# Patient Record
Sex: Female | Born: 1970
Health system: Southern US, Community
[De-identification: ages and names within clinical notes are randomized; demographics above are authoritative.]

## PROBLEM LIST (undated history)

## (undated) ENCOUNTER — Emergency Department (HOSPITAL_BASED_OUTPATIENT_CLINIC_OR_DEPARTMENT_OTHER): Admission: EM | Payer: No Typology Code available for payment source | Source: Home / Self Care

## (undated) DIAGNOSIS — K589 Irritable bowel syndrome without diarrhea: Secondary | ICD-10-CM

## (undated) DIAGNOSIS — G2581 Restless legs syndrome: Secondary | ICD-10-CM

## (undated) DIAGNOSIS — K219 Gastro-esophageal reflux disease without esophagitis: Secondary | ICD-10-CM

## (undated) HISTORY — DX: Gastro-esophageal reflux disease without esophagitis: K21.9

## (undated) HISTORY — DX: Irritable bowel syndrome, unspecified: K58.9

## (undated) HISTORY — DX: Restless legs syndrome: G25.81

---

## 1999-09-14 ENCOUNTER — Ambulatory Visit (HOSPITAL_COMMUNITY): Admission: RE | Admit: 1999-09-14 | Discharge: 1999-09-14 | Payer: Self-pay | Admitting: Obstetrics and Gynecology

## 1999-10-05 ENCOUNTER — Inpatient Hospital Stay (HOSPITAL_COMMUNITY): Admission: AD | Admit: 1999-10-05 | Discharge: 1999-10-05 | Payer: Self-pay | Admitting: Obstetrics and Gynecology

## 1999-10-14 ENCOUNTER — Inpatient Hospital Stay (HOSPITAL_COMMUNITY): Admission: AD | Admit: 1999-10-14 | Discharge: 1999-10-14 | Payer: Self-pay | Admitting: Obstetrics and Gynecology

## 1999-10-17 ENCOUNTER — Inpatient Hospital Stay (HOSPITAL_COMMUNITY): Admission: AD | Admit: 1999-10-17 | Discharge: 1999-10-19 | Payer: Self-pay | Admitting: Obstetrics and Gynecology

## 1999-11-20 ENCOUNTER — Other Ambulatory Visit: Admission: RE | Admit: 1999-11-20 | Discharge: 1999-11-20 | Payer: Self-pay | Admitting: Obstetrics and Gynecology

## 2001-04-09 ENCOUNTER — Emergency Department (HOSPITAL_COMMUNITY): Admission: EM | Admit: 2001-04-09 | Discharge: 2001-04-10 | Payer: Self-pay | Admitting: *Deleted

## 2001-04-10 ENCOUNTER — Encounter: Payer: Self-pay | Admitting: *Deleted

## 2001-11-16 ENCOUNTER — Ambulatory Visit (HOSPITAL_COMMUNITY): Admission: RE | Admit: 2001-11-16 | Discharge: 2001-11-16 | Payer: Self-pay | Admitting: Internal Medicine

## 2001-11-16 ENCOUNTER — Encounter: Payer: Self-pay | Admitting: Internal Medicine

## 2001-12-30 ENCOUNTER — Other Ambulatory Visit: Admission: RE | Admit: 2001-12-30 | Discharge: 2001-12-30 | Payer: Self-pay | Admitting: Obstetrics and Gynecology

## 2002-07-22 ENCOUNTER — Ambulatory Visit (HOSPITAL_COMMUNITY): Admission: AD | Admit: 2002-07-22 | Discharge: 2002-07-22 | Payer: Self-pay | Admitting: Obstetrics and Gynecology

## 2002-09-08 ENCOUNTER — Inpatient Hospital Stay (HOSPITAL_COMMUNITY): Admission: AD | Admit: 2002-09-08 | Discharge: 2002-09-14 | Payer: Self-pay | Admitting: Obstetrics & Gynecology

## 2002-09-09 ENCOUNTER — Encounter: Payer: Self-pay | Admitting: Obstetrics & Gynecology

## 2002-09-10 ENCOUNTER — Encounter: Payer: Self-pay | Admitting: Obstetrics and Gynecology

## 2002-09-13 ENCOUNTER — Encounter: Payer: Self-pay | Admitting: Obstetrics and Gynecology

## 2002-09-20 ENCOUNTER — Encounter (INDEPENDENT_AMBULATORY_CARE_PROVIDER_SITE_OTHER): Payer: Self-pay | Admitting: Specialist

## 2002-09-20 ENCOUNTER — Inpatient Hospital Stay (HOSPITAL_COMMUNITY): Admission: RE | Admit: 2002-09-20 | Discharge: 2002-09-22 | Payer: Self-pay | Admitting: Obstetrics and Gynecology

## 2002-09-23 ENCOUNTER — Encounter: Admission: RE | Admit: 2002-09-23 | Discharge: 2002-10-23 | Payer: Self-pay | Admitting: Obstetrics and Gynecology

## 2002-10-28 ENCOUNTER — Other Ambulatory Visit: Admission: RE | Admit: 2002-10-28 | Discharge: 2002-10-28 | Payer: Self-pay | Admitting: Obstetrics and Gynecology

## 2003-01-11 ENCOUNTER — Ambulatory Visit (HOSPITAL_COMMUNITY): Admission: RE | Admit: 2003-01-11 | Discharge: 2003-01-11 | Payer: Self-pay | Admitting: Internal Medicine

## 2003-01-11 ENCOUNTER — Encounter: Payer: Self-pay | Admitting: Internal Medicine

## 2003-05-04 ENCOUNTER — Ambulatory Visit (HOSPITAL_COMMUNITY): Admission: RE | Admit: 2003-05-04 | Discharge: 2003-05-04 | Payer: Self-pay | Admitting: Otolaryngology

## 2003-05-04 ENCOUNTER — Encounter: Payer: Self-pay | Admitting: Otolaryngology

## 2003-10-24 ENCOUNTER — Ambulatory Visit (HOSPITAL_COMMUNITY): Admission: RE | Admit: 2003-10-24 | Discharge: 2003-10-24 | Payer: Self-pay | Admitting: Internal Medicine

## 2004-01-11 ENCOUNTER — Ambulatory Visit (HOSPITAL_COMMUNITY): Admission: RE | Admit: 2004-01-11 | Discharge: 2004-01-11 | Payer: Self-pay | Admitting: Otolaryngology

## 2004-02-24 ENCOUNTER — Other Ambulatory Visit: Admission: RE | Admit: 2004-02-24 | Discharge: 2004-02-24 | Payer: Self-pay | Admitting: Obstetrics and Gynecology

## 2004-04-30 ENCOUNTER — Ambulatory Visit (HOSPITAL_COMMUNITY): Admission: RE | Admit: 2004-04-30 | Discharge: 2004-04-30 | Payer: Self-pay | Admitting: Internal Medicine

## 2004-05-03 ENCOUNTER — Ambulatory Visit (HOSPITAL_COMMUNITY): Admission: RE | Admit: 2004-05-03 | Discharge: 2004-05-03 | Payer: Self-pay | Admitting: Internal Medicine

## 2004-09-09 HISTORY — PX: UPPER GASTROINTESTINAL ENDOSCOPY: SHX188

## 2004-12-11 ENCOUNTER — Ambulatory Visit (HOSPITAL_COMMUNITY): Admission: RE | Admit: 2004-12-11 | Discharge: 2004-12-11 | Payer: Self-pay | Admitting: Internal Medicine

## 2005-05-15 ENCOUNTER — Other Ambulatory Visit: Admission: RE | Admit: 2005-05-15 | Discharge: 2005-05-15 | Payer: Self-pay | Admitting: Obstetrics and Gynecology

## 2005-05-16 ENCOUNTER — Ambulatory Visit: Payer: Self-pay | Admitting: Orthopedic Surgery

## 2005-10-23 ENCOUNTER — Ambulatory Visit: Payer: Self-pay | Admitting: Internal Medicine

## 2005-12-25 ENCOUNTER — Ambulatory Visit: Payer: Self-pay | Admitting: Orthopedic Surgery

## 2006-08-28 ENCOUNTER — Ambulatory Visit: Payer: Self-pay | Admitting: Internal Medicine

## 2006-10-03 ENCOUNTER — Ambulatory Visit: Payer: Self-pay | Admitting: Internal Medicine

## 2007-02-11 ENCOUNTER — Ambulatory Visit: Payer: Self-pay | Admitting: Internal Medicine

## 2007-02-18 ENCOUNTER — Ambulatory Visit (HOSPITAL_COMMUNITY): Admission: RE | Admit: 2007-02-18 | Discharge: 2007-02-18 | Payer: Self-pay | Admitting: Internal Medicine

## 2007-06-18 ENCOUNTER — Ambulatory Visit: Payer: Self-pay | Admitting: Orthopedic Surgery

## 2007-06-19 ENCOUNTER — Ambulatory Visit (HOSPITAL_COMMUNITY): Admission: RE | Admit: 2007-06-19 | Discharge: 2007-06-19 | Payer: Self-pay | Admitting: Orthopedic Surgery

## 2007-09-10 HISTORY — PX: RECTOCELE REPAIR: SHX761

## 2007-10-16 ENCOUNTER — Ambulatory Visit (HOSPITAL_COMMUNITY): Admission: RE | Admit: 2007-10-16 | Discharge: 2007-10-16 | Payer: Self-pay | Admitting: Obstetrics and Gynecology

## 2008-03-24 ENCOUNTER — Inpatient Hospital Stay (HOSPITAL_COMMUNITY): Admission: RE | Admit: 2008-03-24 | Discharge: 2008-03-25 | Payer: Self-pay | Admitting: Obstetrics and Gynecology

## 2009-06-26 ENCOUNTER — Ambulatory Visit (HOSPITAL_COMMUNITY): Admission: RE | Admit: 2009-06-26 | Discharge: 2009-06-26 | Payer: Self-pay | Admitting: Neurosurgery

## 2009-10-10 ENCOUNTER — Ambulatory Visit: Payer: Self-pay | Admitting: Orthopedic Surgery

## 2009-10-10 DIAGNOSIS — S86819A Strain of other muscle(s) and tendon(s) at lower leg level, unspecified leg, initial encounter: Secondary | ICD-10-CM

## 2009-10-10 DIAGNOSIS — S838X9A Sprain of other specified parts of unspecified knee, initial encounter: Secondary | ICD-10-CM | POA: Insufficient documentation

## 2010-08-29 ENCOUNTER — Ambulatory Visit: Payer: Self-pay | Admitting: Orthopedic Surgery

## 2010-08-29 DIAGNOSIS — I890 Lymphedema, not elsewhere classified: Secondary | ICD-10-CM | POA: Insufficient documentation

## 2010-08-30 ENCOUNTER — Encounter: Payer: Self-pay | Admitting: Orthopedic Surgery

## 2010-09-29 ENCOUNTER — Encounter: Payer: Self-pay | Admitting: Internal Medicine

## 2010-10-09 NOTE — Progress Notes (Signed)
Summary: Progress notes  Initial evaluation   Imported By: Jacklynn Ganong 10/10/2009 09:16:18  _____________________________________________________________________  External Attachment:    Type:   Image     Comment:   External Document

## 2010-10-09 NOTE — Progress Notes (Signed)
Summary: Initial evaluation  Initial evaluation   Imported By: Jacklynn Ganong 10/10/2009 09:17:14  _____________________________________________________________________  External Attachment:    Type:   Image     Comment:   External Document

## 2010-10-09 NOTE — Letter (Signed)
Summary: History form  History form   Imported By: Jacklynn Ganong 10/12/2009 16:25:42  _____________________________________________________________________  External Attachment:    Type:   Image     Comment:   External Document

## 2010-10-09 NOTE — Assessment & Plan Note (Signed)
Summary: ?torn calf muscle/needs xr/umr/bsf   Vital Signs:  Patient profile:   40 year old female Pulse rate:   66 / minute Resp:     16 per minute  Vitals Entered By: Fuller Canada MD (October 10, 2009 4:07 PM)  Visit Type:  Follow-up  CC:  LEFT calf pain.  History of Present Illness: 40 year old female injured her LEFT calf over the weekend and she gel over a log while she was hiking.  She complained of immediate medial calf pain developed a limp swelling tenderness.  No knee pain no ankle pain  She placed a Cam Walker on and then ace wrap on and this has helped significantly.  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 MS:  Complains of joint pain; denies rheumatoid arthritis, joint swelling, gout, bone cancer, osteoporosis, and ; muscle pain.  The review of systems is negative for General, Cardiac , Resp, GU, Neuro, Endo, Psych, Derm, EENT, Immunology, and Lymphatic.  Physical Exam  Additional Exam:  This a well-developed well-nourished female grooming and hygiene is intact body habitus medium  Cardiovascular signs are normal.  Skin is intact  Sensation is normal.  Coordination and balance are normal  She is walking with alymph, favoring the LEFT leg has a Cam Walker on.  On inspection of tenderness in  the medial gastroc nontender lateral gastroc Achilles tendon intact ankle motion normal ankle stable knee stable.  Full range of motion in the knee.     Impression & Recommendations:  Problem # 1:  SPRAIN&STRAIN OTHER SPECIFIED SITES KNEE&LEG (ICD-844.8) Assessment New  Orders: Est. Patient Level III (60454)  Patient Instructions: 1)  Should take about 2 weeks  2)  Use compression , ice , active ROM of the ankle [ankle pumps] 3)  Please schedule a follow-up appointment as needed.

## 2010-10-11 NOTE — Letter (Signed)
Summary: CMN for pneumatic  compression device  CMN for pneumatic  compression device   Imported By: Jacklynn Ganong 08/30/2010 09:08:47  _____________________________________________________________________  External Attachment:    Type:   Image     Comment:   External Document

## 2010-10-11 NOTE — Assessment & Plan Note (Signed)
Summary: LEFT LEG PAIN/SWELLING/NEEDS XR/UMR   Allergies: No Known Drug Allergies   Other Orders: Est. Patient Level III (16109)   Orders Added: 1)  Est. Patient Level III [60454]

## 2010-10-11 NOTE — Miscellaneous (Signed)
    40 year old female, status post injury to her LEFT calf presents back with LEFT calf pain  and   lymphedema; and has had some therapy, which she would like reordered. Currently, on Zantac, no anti-inflammatories at present.  Denies any redness, or warmth.  However, complains also that the area is hard and thick.  ROS:  h/o vein stripping; calf medial tear   Physical Exam   GENERAL: Appearance is normal   CDV: normal pulse and temperature   Neuro: sensation was normal   MSK: gait normal   * Inspection: calf size: 18 vs 17 in and minimal tenderness  * ROM knee and ankle normal  *MOTOR: 5/5  *Stability no joint laxity in the ankle   IMPRESSIONS:  LYMPEDEMAPLAN:  THERAPEUTIC INTERVENTIONS

## 2011-01-22 NOTE — Discharge Summary (Signed)
[name], [name]                 ACCOUNT NO.:  [id number]   MEDICAL RECORD NO.:  [id number]          PATIENT TYPE:  INP   LOCATION:  9308                          FACILITY:  [facility]   PHYSICIAN:  Dineen Kid. Rana Snare, M.D.    DATE OF BIRTH:  10/15/1970   DATE OF ADMISSION:  03/24/2008  DATE OF DISCHARGE:  03/25/2008                               DISCHARGE SUMMARY   HISTORY OF PRESENT ILLNESS:  [name] is a 40 year old G2, P2 with  symptomatic pelvic relaxation with digitalization of her constipation,  mild urinary incontinence and pressure symptoms.  She desires any  further incontinence throughout the procedure workup, but she does  desire definitive surgical intervention and treatment of her cystocele  and rectocele and presents for anterior and posterior colporrhaphy.  She  also has a Mirena IUD, which scheduled to be removed and this has been  low with 5 years.  Her husband is scheduled for a vasectomy.   HOSPITAL COURSE:  The patient underwent anterior and posterior  colporrhaphy.  The procedure was uncomplicated.  We also removed the IUD  at that time.  Estimated blood loss during the procedure was 100 mL.  Her postoperative care was unremarkable.   On postop day #1, she had her Foley catheter discontinued.  She was  ambulating without difficulty, tolerating regular diet.  Hemoglobin was  11.4.  She was able to void spontaneously with 200-400 mL and void with  minimal PVR, as checked by ultrasound and by the evening of  postoperative day #1, she desired discharge to home, was tolerating oral  medication for pain, ambulating without difficulty, voiding on her own,  and tolerating a regular diet.  Her abdomen is soft, nontender, and  nondistended.  Previous packing, which was inserted during the surgery  was removed on the morning of postoperative day #1.   DISPOSITION:  The patient was discharged home with followup in the  office 1-2 weeks and offered a prescription for oxycodone,  #30, take as  needed and Ambien, #30, at 10 mg as needed.  I told to return if  increased pain, fever, or bleeding.      Dineen Kid Rana Snare, M.D.  Electronically Signed     DCL/MEDQ  D:  03/25/2008  T:  03/26/2008  Job:  [id number]

## 2011-01-22 NOTE — Op Note (Signed)
[name], [name]                 ACCOUNT NO.:  [id number]   MEDICAL RECORD NO.:  [id number]          PATIENT TYPE:  INP   LOCATION:  9308                          FACILITY:  [facility]   PHYSICIAN:  Dineen Kid. Rana Snare, M.D.    DATE OF BIRTH:  1970/10/28   DATE OF PROCEDURE:  03/24/2008  DATE OF DISCHARGE:                               OPERATIVE REPORT   PREOPERATIVE DIAGNOSES:  Symptomatic pelvic relaxation with cystocele  and rectocele, requiring digitalization, and desires intrauterine device  removal.   POSTOPERATIVE DIAGNOSES:  Symptomatic pelvic relaxation with cystocele  and rectocele, requiring digitalization, and desires intrauterine device  removal.   PROCEDURE:  Anterior-posterior colporrhaphy with intrauterine device  removal.   SURGEON:  Dineen Kid. Rana Snare, MD   ASSISTANT:  Juluis Mire, MD   ANESTHESIA:  General endotracheal.   INDICATIONS:  [name] is a 40 year old G2, P2 worsening problems  with symptomatic pelvic relaxation mostly with digitalization for  constipation.  She does have some mild urinary incontinence and some  pressure symptoms, declines any type of incontinence, procedure, or  workup at this time.  She does currently have a Mirena IUD, which is no  longer guaranteed effective.  It has been longer than 5 years.  She  desires its removal.  Her husband is getting a vasectomy this month.  The risks and benefits of the procedure were discussed at length, which  include but not limited to risk; infection; bleeding; damage to the  uterus, tubes ovary, bowel or bladder; risks associated with worsening  incontinence or catheter wear; and possibility this procedure may not  lasts and may require lifestyle modification including no heavy lifting  in the future.  She does give an informed consent and wished to proceed.   FINDINGS:  At the time of surgery, second-degree cystocele and third-  degree rectocele.   DESCRIPTION OF PROCEDURE:  After adequate analgesia,  the patient was  placed in the dorsal lithotomy position.  She was sterilely prepped and  draped.  Bladder was sterilely drained.  A weighted speculum was placed.  A sponge forceps was used to the to grasp the IUD string, the IUD was  easily removed, and appeared to be normal.  The anterior vaginal mucosa  was grasped at the uterovesical junction.  A midline incision was made,  and the anterior vaginal mucosa was reflected off the cystocele and  urethra.  The pubovesical cervical fascia was then plicated in the  midline using figure-of-eights of 0 Monocryl suture.  A Kelly plication  stitch was placed at the UV angle with good support of the UV noted and  good support of the cystocele noted.  The excess vaginal mucosa was then  trimmed, and the anterior vaginal mucosa was closed with 2-0 Monocryl in  running fashion with good approximation and good hemostasis.  Two Allis  clamps were placed at the angles of the posterior fourchette.  A  triangular flap of skin was reflected off the perineal body.  The  posterior vaginal mucosa was then undermined and dissected with  Metzenbaum scissors and reflected  laterally off the rectal fascia to the  posterior apex of the vagina to the top of the rectocele.  The rectal  fascia was then plicated in the midline using figure-of-eights of 0  Monocryl suture with good approximation and good support noted  posteriorly.  The excess vaginal mucosa was trimmed posteriorly and the  posterior vaginal mucosa was closed with 2-0 Monocryl in a running  fashion.  A suture of 0 Monocryl was placed in a figure-of-eight fashion  plicating the superficial transverse perineal muscles on the perineal  body.  The perineum was then closed with 2-0 Monocryl in a subcuticular  fashion with good approximation and good hemostasis noted.  Vaginal exam  at this time reveals good support of the cystocele in UV angle.  A Foley  catheter was placed with return of clear yellow urine.   The posterior  repair had good posterior support to the apex of the vagina, and the  rectovaginal confirmed the above.  A 1-inch gauze was placed, coated  with estrogen, packing the vagina.  The patient tolerated the procedure  well and was stable on transfer to recovery room.  Sponge and instrument  count was normal x3.  Estimated blood loss 100 mL.  The patient received  1 g of cefotetan preoperatively.      Dineen Kid Rana Snare, M.D.  Electronically Signed     DCL/MEDQ  D:  03/24/2008  T:  03/24/2008  Job:  [id number]

## 2011-01-22 NOTE — Assessment & Plan Note (Signed)
[name], [name]                  CHART#:  [id number]   DATE:  02/11/2007                       DOB:  1970-11-02   PRESENTING COMPLAINTS:  Persistent LUQ abdominal pain.   [name] is a 40 year old Caucasian female patient of Dr. Alonza Smoker who was  here for scheduled visit.  She was last seen in 09/2006 for nausea, left  sided abdominal pain and irregular bowel movements.  I felt she had IBS.  I was not sure as to source of her nausea but PPI was recommended.  She  was also advised fiber supplement, Colace for a bowel movement and  Levbid.  She has seen her gynecologist. She has rectocele which appears  to be contributing to her constipation but she states she is doing much  better as long as she stays on high fiber diet and takes her Colace.  Her nausea is resolved.  She did not see any improvement in her pain  with Levbid.  Presently she is not taking any medicines other than  Colace and fiber supplement.  She continues to have pain in her left  upper quadrant.  She states she has a sore spot which is more or less  constant. She may be of benefit when she is leaning against a chair or  table.  Other times she has a shooting pain which radiates to her back.  She has not been able to pinpoint any triggers.  She does not feel that  this is pain is related to her work.  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  Her  appetite is very good.  She is concerned about gradual weight gain.  She  has gained about 5 pounds in a year and a half.  She states she is [facility]  [facility] although she has not been in the last two weeks.   OBJECTIVE:  Weight 196 pounds, she is 5 feet 11 inches tall.  Pulse 64.  Blood pressure 100/70, temperature is 98.2.  Conjunctivae is pink.  Sclera is nonicteric.  No neck masses are noted.  Her abdomen is  symmetrical, bowel sounds are normal. She has localized tenderness under  the left rib cage.  She also has  some increase going to her rectus  abdominis muscle compared to the right side, no organomegaly or masses  noted.   ASSESSMENT:  Left upper quadrant abdominal pain.  She has had this for a  few months now.  Pain has not improved with resolution of her  constipation with dietary measures and Colace.  She has not responded to  antispasmodic therapy.  She had essentially normal EGD back in 04/2004.  At that time she also had abdominal pelvic CT which was unremarkable.  However, her symptomatology was different and she was also having fever.  I wonder if this is musculoskeletal or neuropathic pain.  Evaluation of  her back is recommended.  We need to rule out intraabdominal process.   PLAN:  Abdominal CT with oral and IV contrast to be performed at [facility] in  the near future.       Lionel December, M.D.  Electronically Signed     NR/MEDQ  D:  02/11/2007  T:  02/12/2007  Job:  [id number]   cc:  Kingsley Callander. Ouida Sills, MD

## 2011-01-22 NOTE — H&P (Signed)
NAMEDESARAI, BARRACK                 ACCOUNT NO.:  [id number]   MEDICAL RECORD NO.:  [id number]          PATIENT TYPE:  AMB   LOCATION:  SDC                           FACILITY:  [facility]   PHYSICIAN:  Dineen Kid. Rana Snare, M.D.    DATE OF BIRTH:  01/07/1971   DATE OF ADMISSION:  03/24/2008  DATE OF DISCHARGE:                              HISTORY & PHYSICAL   HISTORY OF PRESENT ILLNESS:  Ms. Cuda is a 40 year old G2, P2 with  worsening problems with symptomatic pelvic relaxation, mostly  digitalization for constipation.  She does have some mild urinary  incontinence and pressure symptoms but declines any type of incontinence  procedure or workup at this time.  Currently she has a Mirena IUD, which  is no longer guaranteed effective.  It has been longer than 5 years.  Although she has had amenorrhea, she desires removal of this.  Her  husband is actually getting ready to have a vasectomy in the near  future.  She presents for anterior posterior colporrhaphy with removal  of the IUD.  Also of note the patient does not wish hysterectomy or any  type of procedure to the uterus.   PAST MEDICAL HISTORY:  Negative.   PAST SURGICAL HISTORY:  Negative.   PAST OBSTETRICAL HISTORY:  She has had 2 vaginal deliveries.   MEDICATIONS:  None.   ALLERGIES:  She has no known drug allergies.   PHYSICAL EXAMINATION:  VITAL SIGNS:  Her blood pressure is 110/70.  She  is 5 foot, 11 inches.  She is 201 pounds.  HEART:  Regular rate and rhythm.  LUNGS:  Clear to auscultation bilaterally.  ABDOMEN:  Nondistended, nontender.  BREASTS:  Without masses, lymphadenopathy or discharge.  PELVIC:  She has mild loss of the UV angle.  She has a second-degree  cystocele, a third-degree rectocele with Valsalva, which does come to  the introitus.   IMPRESSION AND PLAN:  Symptomatic pelvic relaxation with cystocele and  rectocele requiring digitalization.   PLAN:  Anterior and posterior colporrhaphy.  She declines any  type of  incontinence procedure.  We will, however, place a Kelly plication at  the UV angle.  She desires removal of the IUD at that time.  She will  need to use other forms of contraception until her husband is clear from  his vasectomy and understands this.  I did discuss the risks and  benefits of the procedure at length, which include but are not limited  to risk of infection, bleeding, damage to  the uterus, tubes, ovary, bowel, bladder, ureters.  Possibly this may  worsen, not improve the urinary incontinence.  Discussed catheter wear,  the possibility that this procedure may not last.  She also needs to  limit her lifting in the future.  She does give informed consent.      Dineen Kid Rana Snare, M.D.  Electronically Signed     DCL/MEDQ  D:  03/23/2008  T:  03/23/2008  Job:  [id number]

## 2011-01-25 NOTE — Discharge Summary (Signed)
[name], [name]                           ACCOUNT NO.:  [id number]   MEDICAL RECORD NO.:  [id number]                   PATIENT TYPE:  INP   LOCATION:  9118                                 FACILITY:  [facility]   PHYSICIAN:  Michelle L. Vincente Poli, M.D.            DATE OF BIRTH:  August 09, 1971   DATE OF ADMISSION:  09/20/2002  DATE OF DISCHARGE:  09/22/2002                                 DISCHARGE SUMMARY   ADMISSION DIAGNOSES:  1. Intrauterine pregnancy at 37 weeks estimated gestational age.  2. Placenta previa.   DISCHARGE DIAGNOSES:  1. Status post low transverse cesarean section.  2. Viable female infant.   PROCEDURE:  Primary low transverse cesarean section.   REASON FOR ADMISSION:  Please see dictated H&P.   HOSPITAL COURSE:  The patient is a 40 year old gravida 2 para 1 that is  admitted to the [facility] for a scheduled cesarean  delivery.  The patient's pregnancy had been complicated by placenta previa.  The patient required hospitalization last week for vaginal bleeding.  Uterine contractions were tocolyzed and the bleeding resolved.  The patient  is Rh negative.  RhoGAM was administered.  Amniocentesis revealed an L/S  ratio that was immature.  Betamethasone was administered.  The patient  continued on oral terbutaline due to preterm contractions.  Ultrasound  revealed the placental edge to be 0.5 mm from the internal cervical os  consistent with marginal placenta previa.  C section was scheduled.  On the  a.m. of her surgery the patient was prepped accordingly and taken to the  operating room where spinal anesthesia was administered without difficulty.  A low transverse incision was made with the delivery of a viable female infant  weighing 7 pounds 0 ounces with Apgars of 9 at one minute, 9 at five  minutes.  Umbilical artery pH was 7.37.  The patient tolerated the procedure  well and was taken to the recovery room in stable condition.  On  postoperative day  #1 the patient had good return of bowel function.  Abdomen  was soft.  Fundus was firm and nontender.  Abdominal dressing was noted to  have old drainage noted on the bandage.  Labs revealed hemoglobin 9.7; wbc  count 11.3; platelet count 187,000.  On postoperative day #2 the patient  desired early discharge.  Fundus was firm and nontender.  Abdomen was soft.  Incision was clean, dry, and intact; staples were intact.  RhoGAM had been  given previously and was not indicated at the time of discharge.   CONDITION ON DISCHARGE:  Stable.   DIET:  Regular as tolerated.   ACTIVITY:  No heavy lifting, no vaginal entry, no driving x2 weeks.   FOLLOW-UP:  The patient is to follow up in the office in three to four days  for staple removal.  Otherwise, she is to return back to the office in  approximately one to  two weeks for an incision check.  She is to call for  temperature greater than 100 degrees, persistent nausea and vomiting, heavy  vaginal bleeding, and/or redness or drainage of the incisional site.   DISCHARGE MEDICATIONS:  1. Percocet 5/325 #30 one p.o. q.4-6h. p.r.n. pain.  2. Ibuprofen 600 mg over-the-counter q.6h.  3. Prenatal vitamins one p.o. daily.  4. Colace one p.o. daily p.r.n.     [name], N.P.                        Stann Mainland. Vincente Poli, M.D.    CC/MEDQ  D:  10/26/2002  T:  10/26/2002  Job:  [id number]

## 2011-01-25 NOTE — Op Note (Signed)
[name], [name]                           ACCOUNT NO.:  [id number]   MEDICAL RECORD NO.:  [id number]                   PATIENT TYPE:  AMB   LOCATION:  [facility]                                 [facility]ACILITY:  [facility]   PHYSICIAN:  Dineen Kid. Rana Snare, M.D.                 DATE OF BIRTH:  1971-05-09   DATE OF PROCEDURE:  09/20/2002  DATE OF DISCHARGE:                                 OPERATIVE REPORT   PREOPERATIVE DIAGNOSES:  1. Intrauterine pregnancy at 37 weeks.  2. Placenta previa.   POSTOPERATIVE DIAGNOSES:  1. Intrauterine pregnancy at 37 weeks.  2. Placenta previa.   PROCEDURE:  Primary low segment transverse cesarean section.   SURGEON:  Dineen Kid. Rana Snare, M.D.   ASSISTANT:  Stann Mainland. Vincente Poli, M.D.   ANESTHESIA:  Spinal.   INDICATIONS:  The patient is a 40 year old G2, P1 37 weeks.  Pregnancy  complicated by placenta previa.  She was in the hospital last week with  vaginal bleeding.  She was tocolysed due to contractions and bleeding.  Ultrasound showed marginal previa with a leading edge 6 mm from the cervical  os.  She is Rh negative.  Has received RhoGAM.  She also underwent  amniocentesis last week which showed an LS ratio of 1.3.  She was given  betamethasone x2 since then and one week after her last amniocentesis.  Presents for primary cesarean section.  Risks and benefits were discussed at  length.  Informed consent was obtained.   FINDINGS:  Viable female infant.  Apgars were 9 and 9.  pH 7.37 arterial.  Weight 7 pounds 0 ounces.   DESCRIPTION OF PROCEDURE:  After adequate analgesia the patient was placed  in the supine position with left lateral tilt.  She was sterilely prepped  and draped.  Bladder was sterilely drained.  Pfannenstiel skin incision was  made two fingerbreadths above the pubic symphysis which was taken down  sharply to the fascia and incised transversely, extended superiorly and  inferiorly off the bellies of the rectus muscle which was separated sharply  in the midline.  The peritoneum was entered sharply.  Bladder blade was  placed.  Uterine serosa was elevated, nicked, incised transversely.  Bladder  flap was created and placed behind the bladder blade.  A low segment myotomy  incision was made down to the amniotic sac.  Amniotomy was performed.  Infant's vertex was grasped, delivered atraumatically.  Nuchal cord x1  reduced.  Nares and pharynx suctioned.  The infant was then delivered.  Cord  clamped, cut, and handed to the pediatrician for resuscitation.  Cord blood  was obtained.  Placenta extracted manually.  The uterus was exteriorized,  wiped clean with a dry lap.  The myotomy was closed in two layers, the first  being a running layer, the second being an imbricating layer of 0 Monocryl.  Hemostasis was achieved with 0 Monocryl figure-of-eight.  After adequate  hemostasis was assured, uterus was placed back in the peritoneal cavity and  after a copious amount of irrigation adequate hemostasis was assured  peritoneum was closed with 0 Monocryl.  Rectus muscle plicated in the  midline.  Irrigation was applied and after adequate hemostasis the fascia  was closed with a number 1 Vicryl in a running fashion.  Irrigation was once  again applied and after adequate hemostasis the  skin was stapled and Steri-Strips applied.  The patient tolerated the  procedure well.  Was stable on transfer to the recovery room.  Sponge and  instrument count was normal x3.  The patient received 1 g of metoxepin after  delivery of placenta.                                               Dineen Kid Rana Snare, M.D.    DCL/MEDQ  D:  09/20/2002  T:  09/20/2002  Job:  [id number]

## 2011-01-25 NOTE — H&P (Signed)
   [name], [name]                           ACCOUNT NO.:  [id number]   MEDICAL RECORD NO.:  [id number]                   PATIENT TYPE:  INP   LOCATION:  9147                                 FACILITY:  [facility]   PHYSICIAN:  Dineen Kid. Rana Snare, M.D.                 DATE OF BIRTH:  1971-02-13   DATE OF ADMISSION:  09/20/2002  DATE OF DISCHARGE:                                HISTORY & PHYSICAL   HISTORY OF PRESENT ILLNESS:  The patient is a 40 year old G2, P1 at 37 weeks  who presents for primary cesarean section.  Her pregnancy has been  complicated by placenta previa.  She presented this last week to the  hospital for vaginal bleeding.  She was tocolized due to contractions and  bleeding.  NSTs were performed which showed immature fetal lung with an LS  ratio of 1.3.  She was given betamethasone x2 and cesarean section was  scheduled for one week later.  She was continued on the terbutaline due to  preterm contractions.  Ultrasound showed the placental edge to be 0.5 mm  from the internal cervical os consistent with a marginal previa.  Estimated  date of confinement was October 09, 2002.  She is also RH negative and has  received RhoGAM.   PHYSICAL EXAMINATION:  VITAL SIGNS:  Blood pressure is 120/78.  HEART:  Regular rate and rhythm.  LUNGS:  Clear to auscultation bilaterally.  ABDOMEN:  Gravid, nontender.  PELVIC:  Exam is deferred.   LABORATORY DATA:  Ultrasound again shows placental margin 6 mm from the  internal cervix.   IMPRESSION:  1. Intrauterine pregnancy at 37 weeks.  2. Placenta previa.  3. Vaginal bleeding, status post betamethasone last week.   PLAN:  Primary low segment transverse cesarean section.  Risks and benefits  were discussed at length.  This included but not limited to risks of  infection, bleeding, damage to uterus, tubes, ovaries, bowel and bladder and  also risks of prematurity versus the risks of bleeding and placental  abruption.  She does give her  informed consent and wished to proceed with  the cesarean section.                                               Dineen Kid Rana Snare, M.D.    DCL/MEDQ  D:  09/17/2002  T:  09/17/2002  Job:  [id number]

## 2011-01-25 NOTE — Discharge Summary (Signed)
   NAMESABEEN, PIECHOCKI                           ACCOUNT NO.:  [id number]   MEDICAL RECORD NO.:  [id number]                   PATIENT TYPE:  INP   LOCATION:  9147                                 FACILITY:  [facility]   PHYSICIAN:  Michelle L. Vincente Poli, M.D.            DATE OF BIRTH:  1971-04-22   DATE OF ADMISSION:  09/09/2002  DATE OF DISCHARGE:  09/14/2002                                 DISCHARGE SUMMARY   ADMISSION DIAGNOSES:  1. Intrauterine pregnancy 35 and 4/7 weeks.  2. Marginal placenta previa status post bleed.   DISCHARGE DIAGNOSES:  1. Intrauterine pregnancy 35 and 4/7 weeks.  2. Marginal placenta previa status post bleed.   HOSPITAL COURSE:  The patient is a 40 year old G2, P1 at 56 and 4/7 weeks  with history of partial marginal placenta previa.  She presented with a gush  of bright red bleeding which had resolved by the time she had arrived to  triage at the hospital.  She was admitted for observation and evaluation.  On admission the fetal heart rate was reassuring.  Her bleeding had stopped.  The patient was placed on bed rest and was given some terbutaline for  tocolysis and her hemoglobin had remained stable.  The patient did have an  amniocentesis performed on January 2 and the LS was 1.3 _____ PG.  Given  that the patient had no bleeding between January 3 and January 6 she was  discharged home on strict bed rest.  She was given terbutaline 10.5 q.4h. as  well as Protonix and Ambien.  She was given strict bleeding precautions and  she will follow up on Monday for cesarean section to be performed by Onalee Hua  C. Rana Snare, M.D.                                               Michelle L. Vincente Poli, M.D.    Florestine Avers  D:  11/04/2002  T:  11/04/2002  Job:  [id number]

## 2011-01-25 NOTE — Op Note (Signed)
Mcgrath, [name]                           ACCOUNT NO.:  [id number]   MEDICAL RECORD NO.:  [id number]                   PATIENT TYPE:  AMB   LOCATION:  DAY                                  FACILITY:  [facility]   PHYSICIAN:  Lionel December, M.D.                 DATE OF BIRTH:  Mar 18, 1971   DATE OF PROCEDURE:  04/30/2004  DATE OF DISCHARGE:                                 OPERATIVE REPORT   PROCEDURE:  Esophagogastroduodenoscopy.   ENDOSCOPIST:  Lionel December, M.D.   INDICATIONS:  Julie is a 40 year old Caucasian female who has had intermittent  nausea, vomiting, upper abdominal pain, as well as lower abdominal pain for  6 months.  She had normal abdominal ultrasound and normal lab studies.  She  tried OTC PPI for 2 weeks or so which did not help.  She has been on  antispasmodics for 2 weeks, but still symptomatic.  She is, therefore,  undergoing diagnostic EGD.  The procedure and risks were reviewed with the  patient and informed consent was obtained.   PREOPERATIVE MEDICATIONS:  Demerol 50 mg IV and Versed 6 mg IV in divided  dose.  Cetacaine spray for topical oropharyngeal anesthesia.   FINDINGS:  Procedure performed in endoscopy suite.  The patient's vital  signs and O2 saturation were monitored during the procedure and remained  stable.  The patient was placed in the left lateral recumbent position and  Olympus videoscope was passed via the oropharynx without any difficulty into  the esophagus.   ESOPHAGUS:  Mucosa of the esophagus was normal.  Squamocolumnar junction was  unremarkable.  No hernia was noted.   STOMACH:  It had moderate amount of bile in it; 200 mL was suctioned out.  There was no food debris.  Stomach distended very well with insufflation.  Folds of the proximal stomach were normal.  Examination of the mucosa at  gastric body, antrum, pyloric channel as well as angularis, fundus, and  cardia were normal.   DUODENUM:  Mucosa of the bulb normal.  The scope was  passed into the second  and third part of the duodenum where the mucosa and folds were normal.   Endoscope was withdrawn.  The patient tolerated the procedure well.   FINAL DIAGNOSES:  1. Moderate amount of bile in the stomach but normal     esophagogastroduodenoscopy.  2. Incidental finding of moderate amount of bile in the stomach which may or     may not have anything to do with her nausea and vomiting.   PLAN:  1. Will proceed with small bowel follow through.  If not today, maybe later     in the week.  2. If this study is also normal we will try her on Carafate 2 gm at bedtime     and will do abdominal pelvic CT.      ___________________________________________  Lionel December, M.D.   NR/MEDQ  D:  04/30/2004  T:  04/30/2004  Job:  [id number]   cc:   Kingsley Callander. Ouida Sills, M.D.  [address]  [place]  [place] [address]  Fax: [phone]

## 2011-06-07 LAB — CBC
HCT: 33.2 — ABNORMAL LOW
Hemoglobin: 13.9
Platelets: 123 — ABNORMAL LOW
Platelets: 248
RBC: 3.57 — ABNORMAL LOW
RBC: 4.35
WBC: 5.8
WBC: 7.9

## 2011-07-29 ENCOUNTER — Ambulatory Visit (INDEPENDENT_AMBULATORY_CARE_PROVIDER_SITE_OTHER): Payer: Commercial Managed Care - PPO | Admitting: Internal Medicine

## 2011-07-29 ENCOUNTER — Encounter (INDEPENDENT_AMBULATORY_CARE_PROVIDER_SITE_OTHER): Payer: Self-pay | Admitting: Internal Medicine

## 2011-07-29 VITALS — BP 110/72 | HR 76 | Temp 97.6°F | Resp 14 | Ht 71.0 in | Wt 221.0 lb

## 2011-07-29 DIAGNOSIS — R1012 Left upper quadrant pain: Secondary | ICD-10-CM | POA: Insufficient documentation

## 2011-07-29 DIAGNOSIS — K219 Gastro-esophageal reflux disease without esophagitis: Secondary | ICD-10-CM

## 2011-07-29 MED ORDER — RANITIDINE HCL 150 MG PO TABS: 150.0000 mg | ORAL_TABLET | Freq: Two times a day (BID) | ORAL | Status: DC

## 2011-07-29 NOTE — Patient Instructions (Addendum)
Keep a symptom diary as to frequency of pain and associated symptoms. Can take Advil 2 tablets up to 3 times a day with meals for this pain. Notify if you have next spell of pain so that you could be evaluated while you're having symptoms.

## 2011-07-29 NOTE — Progress Notes (Signed)
Presenting complaint; Left upper quadrant abdominal pain. Subjective: Patient is 40 year old Caucasian female who presents for reevaluation of recurrent LUQ abdominal pain. Last episode occurred 3-4 weeks ago when she called to make an appointment. But now the pain is resolved. She has had this pain off and on for over 5 years. She says this pain comes in spells and it occurs it may last for several days. She does not experience any associated symptoms such as nausea, vomiting, fever, chills, cough, change in her bowel habits or hematuria. She has tried dicyclomine and Levbid in the past but without any relief. This pain may sometimes radiate into her left lower quadrant. She remains with irregular bowel movements. She either has diarrhea and/or constipation. She is not sure if she has more pain with one or the other. He remains with good appetite and her weight has been stable. She would like to get a prescription for ranitidine which was given to her by her gynecologist and it is working well. Current Medications: Current Outpatient Prescriptions  Medication Sig Dispense Refill  . Multiple Vitamin (DAILY MULTIVITAMIN PO) Take by mouth.        . ranitidine (ZANTAC) 150 MG tablet Take 1 tablet (150 mg total) by mouth 2 (two) times daily.  180 tablet  3  . rOPINIRole (REQUIP) 0.5 MG tablet Take 0.5 mg by mouth at bedtime.        . vitamin B-12 (CYANOCOBALAMIN) 1000 MCG tablet Take 1,000 mcg by mouth daily.        Marland Kitchen DISCONTD: ranitidine (ZANTAC) 150 MG tablet Take 150 mg by mouth.         Objective: BP 110/72  Pulse 76  Temp(Src) 97.6 F (36.4 C) (Oral)  Resp 14  Ht 5\' 11"  (1.803 m)  Wt 221 lb (100.245 kg)  BMI 30.82 kg/m2  LMP 07/25/2011 Conjunctiva is pink. Sclerae nonicteric. Oropharyngeal mucosa is normal. No neck masses or thyromegaly noted. No tenderness noted at renal angles. Abdomen is symmetrical. Bowel sounds are normal. Palpation reveals soft abdomen with mild tenderness at LUQ  on deep palpation but no evidence of hepatosplenomegaly. No LE edema or clubbing. Prior workup Abdominopelvic CT with contrast and August 2005 in June 2008 unremarkable. Normal small bowel follow-through in August 2005.  Assessment: #1. Recurrent/chronic left upper quadrant pain. She's had this pain off and on for at least 6 years. She has not responded to therapy for IBS. Prior workup has included abdominal pelvic CT in 2005 and 2008 and a small bowel follow-through 2005. This pain does not appear to be of GI origin. I suspect we are dealing with musculoskeletal or neuropathic pain. It may be worthwhile to examine her while she is having a spell. #2. Chronic GERD. Symptoms well controlled with H2B.   Plan: Patient will keep symptom diary. Can use ibuprofen OTC 400 mg up to 3 times a day as needed with meals. She will call us and pain relapses. New prescription given for ranitidine  150 mg twice a day for 3 months with 3 RFs.

## 2011-07-30 ENCOUNTER — Other Ambulatory Visit (HOSPITAL_COMMUNITY): Payer: Self-pay | Admitting: Obstetrics and Gynecology

## 2011-07-30 DIAGNOSIS — Z139 Encounter for screening, unspecified: Secondary | ICD-10-CM

## 2011-08-09 ENCOUNTER — Ambulatory Visit (HOSPITAL_COMMUNITY)
Admission: RE | Admit: 2011-08-09 | Discharge: 2011-08-09 | Disposition: A | Discharge status: Home / Self Care | Payer: 59 | Source: Ambulatory Visit | Attending: Obstetrics and Gynecology | Admitting: Obstetrics and Gynecology

## 2011-08-09 ENCOUNTER — Ambulatory Visit (HOSPITAL_COMMUNITY): Payer: 59

## 2011-08-09 DIAGNOSIS — Z1231 Encounter for screening mammogram for malignant neoplasm of breast: Secondary | ICD-10-CM | POA: Insufficient documentation

## 2011-08-09 DIAGNOSIS — Z139 Encounter for screening, unspecified: Secondary | ICD-10-CM

## 2012-05-29 ENCOUNTER — Other Ambulatory Visit: Payer: Self-pay

## 2012-07-15 ENCOUNTER — Encounter (INDEPENDENT_AMBULATORY_CARE_PROVIDER_SITE_OTHER): Payer: Self-pay | Admitting: *Deleted

## 2012-08-04 ENCOUNTER — Other Ambulatory Visit (HOSPITAL_COMMUNITY): Payer: Self-pay | Admitting: Obstetrics and Gynecology

## 2012-08-04 DIAGNOSIS — Z139 Encounter for screening, unspecified: Secondary | ICD-10-CM

## 2012-08-14 ENCOUNTER — Ambulatory Visit (HOSPITAL_COMMUNITY)
Admission: RE | Admit: 2012-08-14 | Discharge: 2012-08-14 | Disposition: A | Discharge status: Home / Self Care | Payer: 59 | Source: Ambulatory Visit | Attending: Obstetrics and Gynecology | Admitting: Obstetrics and Gynecology

## 2012-08-14 DIAGNOSIS — Z1231 Encounter for screening mammogram for malignant neoplasm of breast: Secondary | ICD-10-CM | POA: Insufficient documentation

## 2012-08-14 DIAGNOSIS — Z139 Encounter for screening, unspecified: Secondary | ICD-10-CM

## 2012-08-24 ENCOUNTER — Encounter (INDEPENDENT_AMBULATORY_CARE_PROVIDER_SITE_OTHER): Payer: Self-pay | Admitting: Internal Medicine

## 2012-08-24 ENCOUNTER — Ambulatory Visit (INDEPENDENT_AMBULATORY_CARE_PROVIDER_SITE_OTHER): Payer: 59 | Admitting: Internal Medicine

## 2012-08-24 VITALS — BP 118/78 | HR 76 | Temp 97.8°F | Resp 18 | Ht 70.0 in | Wt 217.8 lb

## 2012-08-24 DIAGNOSIS — R1012 Left upper quadrant pain: Secondary | ICD-10-CM

## 2012-08-24 DIAGNOSIS — K219 Gastro-esophageal reflux disease without esophagitis: Secondary | ICD-10-CM

## 2012-08-24 MED ORDER — RANITIDINE HCL 150 MG PO TABS: 150.0000 mg | ORAL_TABLET | Freq: Two times a day (BID) | ORAL | Status: DC

## 2012-08-24 NOTE — Patient Instructions (Signed)
Notify if you have another spell of abdominal pain. Physician will contact you with results of blood work, urinalysis and CT scan completed

## 2012-08-24 NOTE — Progress Notes (Signed)
Presenting complaint;  Intermittent left upper quadrant abdominal pain.  Subjective:  Patient is 41 year old Caucasian female who presents with recurrent left upper quadrant abdominal pain. She has had this pain intermittently for 5 years. She had abdominopelvic CT in June 2008 and was unremarkable. She was last seen in November 2012 was decided to observe her clinical course. She states she has had 5 episodes of pain this year. Last episode lasted for about 2 weeks. She is now pain-free. When she has this pain she feels sore in her left upper quadrant and feels that she has been kicked in this area. She has more pain when she has still and also when she would lie prone. At times she feels as if there is something in that area which does not belong there. She has not found any association with meals or physical activity. She has irregular bowel movements. She there has diarrhea and her constipation but again she does not have one of the other during episodes with pain. Pain does not radiate to other regions. She denies melena rectal bleeding hematuria or his dysuria. She has good appetite and her weight has been stable. She she has taken a few doses of ibuprofen and she believes it uses the pain. She states her heartburn fell controlled with therapy and would like to get prescription renewed. She also has not noted any change or pain with her occupation. 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  Current Medications: Current Outpatient Prescriptions  Medication Sig Dispense Refill  . Multiple Vitamin (DAILY MULTIVITAMIN PO) Take by mouth.        . ranitidine (ZANTAC) 150 MG tablet Take 1 tablet (150 mg total) by mouth 2 (two) times daily.  180 tablet  3  . rOPINIRole (REQUIP) 0.5 MG tablet Take 0.5 mg by mouth at bedtime.         Past medical history; She has chronic GERD. Symptoms are well controlled with H2B. In 2005 she was evaluated for nausea vomiting and left upper  quadrant abdominal pain. She had EGD which was within normal limits other than bothered her stomach followed by normal small bowel follow-through and she also had abdominopelvic CT in August 2005 which was within normal limits. Irritable bowel syndrome. C-section in January 2004. Anterior and posterior repair in 2009.    Objective: Blood pressure 118/78, pulse 76, temperature 97.8 F (36.6 C), temperature source Oral, resp. rate 18, height 5\' 10"  (1.778 m), weight 217 lb 12.8 oz (98.793 kg), last menstrual period 07/06/2012. Patient is alert and in no acute distress. Conjunctiva is pink. Sclera is nonicteric Oropharyngeal mucosa is normal. No neck masses or thyromegaly noted. Cardiac exam with regular rhythm normal S1 and S2. No murmur or gallop noted. Lungs are clear to auscultation. Abdomen is symmetrical. Bowel sounds are normal. Abdomen is soft on palpation with mild tenderness below left costal large and on deep palpation. No organomegaly or masses. No LE edema or clubbing noted.  Lab data; Abdominopelvic CT from June 2008 reviewed. Tail of pancreas appears to be prominent but no focal abnormalities noted.    Assessment:  #1. Intermittent left upper quadrant pain of 5 years duration. Prior workup has been negative. She has not responded to anti-spasmodic therapy. She has irritable bowel syndrome but irregular bowel movements have no relationship to her pain. She has mild tenderness on exam today below the left costal margin. Her history and exam unfortunately does not provide any clues as to the etiology.  Given location of her pain need to make sure she does not have any lesion in tail of pancreas. Will reevaluate with abdominopelvic CT and if it is negative will consider endoscopic ultrasound to have closer look at pancreas. #2. GERD. Sometimes well controlled with H2 B. #3. Irritable bowel syndrome.   Plan:  She will go to lab for CBC with differential, sedimentation rate,  comprehensive chemistry panel and urinalysis. Will schedule  abdominopelvic CT with contrast and compare with prior study of 2008. New prescription for Ranitidine 150 mg by mouth twice a day given for 90 days with 3 refills.

## 2012-08-26 ENCOUNTER — Ambulatory Visit (HOSPITAL_COMMUNITY)
Admission: RE | Admit: 2012-08-26 | Discharge: 2012-08-26 | Disposition: A | Discharge status: Home / Self Care | Payer: 59 | Source: Ambulatory Visit | Attending: Internal Medicine | Admitting: Internal Medicine

## 2012-08-26 DIAGNOSIS — K589 Irritable bowel syndrome without diarrhea: Secondary | ICD-10-CM | POA: Insufficient documentation

## 2012-08-26 DIAGNOSIS — R1032 Left lower quadrant pain: Secondary | ICD-10-CM | POA: Insufficient documentation

## 2012-08-26 DIAGNOSIS — R1012 Left upper quadrant pain: Secondary | ICD-10-CM | POA: Insufficient documentation

## 2012-08-26 DIAGNOSIS — K219 Gastro-esophageal reflux disease without esophagitis: Secondary | ICD-10-CM | POA: Insufficient documentation

## 2012-08-26 LAB — CBC WITH DIFFERENTIAL/PLATELET
Basophils Relative: 1 % (ref 0–1)
Eosinophils Absolute: 0.4 10*3/uL (ref 0.0–0.7)
Lymphocytes Relative: 45 % (ref 12–46)
MCHC: 35 g/dL (ref 30.0–36.0)
MCV: 85.3 fL (ref 78.0–100.0)
Monocytes Relative: 6 % (ref 3–12)
Neutrophils Relative %: 43 % (ref 43–77)

## 2012-08-26 LAB — SEDIMENTATION RATE: Sed Rate: 5 mm/hr (ref 0–22)

## 2012-08-26 LAB — URINALYSIS
Glucose, UA: NEGATIVE mg/dL
Hgb urine dipstick: NEGATIVE
Leukocytes, UA: NEGATIVE
Specific Gravity, Urine: 1.026 (ref 1.005–1.030)
pH: 6 (ref 5.0–8.0)

## 2012-08-26 LAB — COMPREHENSIVE METABOLIC PANEL
Alkaline Phosphatase: 57 U/L (ref 39–117)
Chloride: 102 mEq/L (ref 96–112)
Creat: 0.72 mg/dL (ref 0.50–1.10)
Glucose, Bld: 92 mg/dL (ref 70–99)
Sodium: 138 mEq/L (ref 135–145)
Total Bilirubin: 0.4 mg/dL (ref 0.3–1.2)

## 2012-08-26 MED ORDER — IOHEXOL 300 MG/ML  SOLN
100.0000 mL | Freq: Once | INTRAMUSCULAR | Status: AC | PRN
  Administered 2012-08-26: 100 mL via INTRAVENOUS

## 2012-08-27 ENCOUNTER — Ambulatory Visit (HOSPITAL_COMMUNITY): Payer: 59

## 2012-10-24 ENCOUNTER — Other Ambulatory Visit: Payer: Self-pay

## 2012-11-02 ENCOUNTER — Telehealth (INDEPENDENT_AMBULATORY_CARE_PROVIDER_SITE_OTHER): Payer: Self-pay | Admitting: *Deleted

## 2012-11-02 ENCOUNTER — Other Ambulatory Visit (INDEPENDENT_AMBULATORY_CARE_PROVIDER_SITE_OTHER): Payer: Self-pay | Admitting: *Deleted

## 2012-11-02 ENCOUNTER — Encounter (INDEPENDENT_AMBULATORY_CARE_PROVIDER_SITE_OTHER): Payer: Self-pay | Admitting: *Deleted

## 2012-11-02 DIAGNOSIS — Z1211 Encounter for screening for malignant neoplasm of colon: Secondary | ICD-10-CM

## 2012-11-02 DIAGNOSIS — Z800 Family history of malignant neoplasm of digestive organs: Secondary | ICD-10-CM

## 2012-11-02 DIAGNOSIS — K5900 Constipation, unspecified: Secondary | ICD-10-CM

## 2012-11-02 MED ORDER — PEG-KCL-NACL-NASULF-NA ASC-C 100 G PO SOLR: 1.0000 | Freq: Once | ORAL | Status: DC

## 2012-11-02 NOTE — Telephone Encounter (Signed)
TCS sch'd 11/27/12 at 930, patient aware

## 2012-11-02 NOTE — Telephone Encounter (Signed)
agree

## 2012-11-02 NOTE — Telephone Encounter (Signed)
  Procedure: tcs  Reason/Indication:  Constipation, fam hx colon ca  (ok per Dr Karilyn Cota)  Has patient had this procedure before?  no  If so, when, by whom and where?    Is there a family history of colon cancer?  Yes, paternal grandfather  Who?  What age when diagnosed?    Is patient diabetic?   no      Does patient have prosthetic heart valve?  no  Do you have a pacemaker?  no  Has patient had joint replacement within last 12 months?  no  Is patient on Coumadin, Plavix and/or Aspirin? no  Medications: see EPIC  Allergies: nkda  Medication Adjustment:   Procedure date & time: 11/27/12 at 930

## 2012-11-02 NOTE — Telephone Encounter (Signed)
Ann- Dr.Rehman called in this morning and he has ask that you arrange a Colonoscopy for [name]. She is having Constipation.

## 2012-11-23 ENCOUNTER — Encounter (HOSPITAL_COMMUNITY): Payer: Self-pay | Admitting: Pharmacy Technician

## 2012-11-27 ENCOUNTER — Encounter (HOSPITAL_COMMUNITY)
Admission: RE | Disposition: A | Discharge status: Home / Self Care | Payer: Self-pay | Source: Ambulatory Visit | Attending: Internal Medicine

## 2012-11-27 ENCOUNTER — Ambulatory Visit (HOSPITAL_COMMUNITY)
Admission: RE | Admit: 2012-11-27 | Discharge: 2012-11-27 | Disposition: A | Discharge status: Home / Self Care | Payer: 59 | Source: Ambulatory Visit | Attending: Internal Medicine | Admitting: Internal Medicine

## 2012-11-27 ENCOUNTER — Encounter (HOSPITAL_COMMUNITY): Payer: Self-pay | Admitting: *Deleted

## 2012-11-27 DIAGNOSIS — R1012 Left upper quadrant pain: Secondary | ICD-10-CM

## 2012-11-27 DIAGNOSIS — D126 Benign neoplasm of colon, unspecified: Secondary | ICD-10-CM

## 2012-11-27 DIAGNOSIS — Z83719 Family history of colon polyps, unspecified: Secondary | ICD-10-CM | POA: Insufficient documentation

## 2012-11-27 DIAGNOSIS — R1032 Left lower quadrant pain: Secondary | ICD-10-CM | POA: Insufficient documentation

## 2012-11-27 DIAGNOSIS — Z800 Family history of malignant neoplasm of digestive organs: Secondary | ICD-10-CM

## 2012-11-27 DIAGNOSIS — K5900 Constipation, unspecified: Secondary | ICD-10-CM | POA: Insufficient documentation

## 2012-11-27 DIAGNOSIS — Z8371 Family history of colonic polyps: Secondary | ICD-10-CM | POA: Insufficient documentation

## 2012-11-27 HISTORY — PX: COLONOSCOPY: SHX5424

## 2012-11-27 SURGERY — COLONOSCOPY
Anesthesia: Moderate Sedation

## 2012-11-27 MED ORDER — MEPERIDINE HCL 50 MG/ML IJ SOLN
INTRAMUSCULAR | Status: AC
  Filled 2012-11-27: qty 1

## 2012-11-27 MED ORDER — MIDAZOLAM HCL 5 MG/5ML IJ SOLN
INTRAMUSCULAR | Status: AC
  Filled 2012-11-27: qty 10

## 2012-11-27 MED ORDER — SODIUM CHLORIDE 0.45 % IV SOLN: INTRAVENOUS | Status: DC

## 2012-11-27 MED ORDER — MIDAZOLAM HCL 5 MG/5ML IJ SOLN
INTRAMUSCULAR | Status: DC | PRN
  Administered 2012-11-27 (×3): 2 mg via INTRAVENOUS

## 2012-11-27 MED ORDER — STERILE WATER FOR IRRIGATION IR SOLN
Status: DC | PRN
  Administered 2012-11-27: 09:00:00

## 2012-11-27 MED ORDER — SODIUM CHLORIDE 0.9 % IV SOLN
INTRAVENOUS | Status: DC
  Administered 2012-11-27: 09:00:00 via INTRAVENOUS

## 2012-11-27 MED ORDER — MEPERIDINE HCL 50 MG/ML IJ SOLN
INTRAMUSCULAR | Status: DC | PRN
  Administered 2012-11-27 (×2): 25 mg via INTRAVENOUS

## 2012-11-27 NOTE — H&P (Signed)
[name] is an 42 y.o. female.   Chief Complaint: Patient is here for colonoscopy. HPI: Patient is 42 year old Caucasian female who presents with constipation, sense of poor evacuation of intermittent left-sided abdominal pain. He underwent rectocele repair few years ago she still has sense of incomplete evacuation.  She denies rectal bleeding. Family history significant for history of colonic polyps in her father and colon carcinoma in maternal grandfather who 7 at the time of diagnosis.  Past Medical History  Diagnosis Date  . Irritable bowel syndrome   . GERD (gastroesophageal reflux disease)   . Restless leg syndrome     Past Surgical History  Procedure Laterality Date  . Rectocele repair  2009  . Upper gastrointestinal endoscopy  2006  . Cesarean section  2004    Family History  Problem Relation Age of Onset  . Healthy Mother   . Hypertension Father   . Healthy Sister   . Healthy Brother   . Healthy Daughter   . Healthy Son   . Colon cancer Other    Social History:  reports that she quit smoking about 6 years ago. Her smoking use included Cigarettes. She smoked 0.00 packs per day. She has never used smokeless tobacco. She reports that  drinks alcohol. She reports that she does not use illicit drugs.  Allergies: No Known Allergies  Medications Prior to Admission  Medication Sig Dispense Refill  . ibuprofen (ADVIL,MOTRIN) 800 MG tablet Take 800 mg by mouth every 8 (eight) hours as needed for pain.      . peg 3350 powder (MOVIPREP) 100 G SOLR Take 1 kit (100 g total) by mouth once.  1 kit  0  . ranitidine (ZANTAC) 150 MG tablet Take 1 tablet (150 mg total) by mouth 2 (two) times daily.  180 tablet  3  . rOPINIRole (REQUIP) 0.5 MG tablet Take 0.5 mg by mouth at bedtime as needed.         Results for orders placed during the hospital encounter of 11/27/12 (from the past 48 hour(s))  PREGNANCY, URINE     Status: None   Collection Time    11/27/12  8:30 AM   Result Value Range   Preg Test, Ur NEGATIVE  NEGATIVE   No results found.  ROS  Blood pressure 131/77, pulse 68, temperature 97.8 F (36.6 C), temperature source Oral, resp. rate 18, height 5\' 10"  (1.778 m), weight 212 lb (96.163 kg), last menstrual period 10/23/2012, SpO2 96.00%. Physical Exam  Constitutional: She appears well-developed and well-nourished.  HENT:  Mouth/Throat: Oropharynx is clear and moist.  Eyes: Conjunctivae are normal. No scleral icterus.  Neck: No thyromegaly present.  Cardiovascular: Normal rate, regular rhythm and normal heart sounds.   No murmur heard. Respiratory: Effort normal and breath sounds normal.  GI: Soft. Bowel sounds are normal. She exhibits no distension and no mass. There is no tenderness.  Musculoskeletal: She exhibits no edema.  Lymphadenopathy:    She has no cervical adenopathy.  Neurological: She is alert.  Skin: Skin is warm and dry.     Assessment/Plan Constipation left-sided abdominal pain   diagnostic colonoscopy.  [name] 11/27/2012, 9:15 AM

## 2012-11-27 NOTE — Op Note (Signed)
COLONOSCOPY PROCEDURE REPORT  PATIENT:  [name]  MR#:  [id number] Birthdate:  1971/06/13, 42 y.o., female Endoscopist:  Dr. Malissa Hippo, MD Referred By:  Dr. Clent Ridges, MD rocedure Date: 11/27/2012  Procedure:   Colonoscopy  Indications: Patient is 42 year old Caucasian female with history of constipation left-sided abdominal pain and or evacuation. Family history significant for colonic adenomas and a father and colon carcinoma in paternal grandfather was 42 at the time of diagnosis.  Informed Consent:  The procedure and risks were reviewed with the patient and informed consent was obtained.  Medications:  Demerol 50 mg IV Versed 6 mg IV  Description of procedure:  After a digital rectal exam was performed, that colonoscope was advanced from the anus through the rectum and colon to the area of the cecum, ileocecal valve and appendiceal orifice. The cecum was deeply intubated. These structures were well-seen and photographed for the record. From the level of the cecum and ileocecal valve, the scope was slowly and cautiously withdrawn. The mucosal surfaces were carefully surveyed utilizing scope tip to flexion to facilitate fold flattening as needed. The scope was pulled down into the rectum where a thorough exam including retroflexion was performed. Terminal ileum was also examined.  Findings:   Prep execellent Normal mucosa of terminal ileum. 4 mm polyp ablated via cold biopsy from ascending colon. Mucosa of rest of the colon and rectum was normal. Unremarkable anorectal junction.   Therapeutic/Diagnostic Maneuvers Performed:  See above  Complications:  None  Cecal Withdrawal Time:  16 minutes  Impression:  Normal terminal ileum. 4 mm polyp ablated via cold biopsy from ascending colon. No other abnormalities noted.  Recommendations:  Standard instructions given. I will contact patient with biopsy results and further recommendations.  Shelley Pooley U  11/27/2012  9:54 AM  CC: Dr. Carylon Perches, MD & Dr. Bonnetta Barry ref. provider found CC: Dr. Clent Ridges, MD

## 2012-12-01 ENCOUNTER — Encounter (INDEPENDENT_AMBULATORY_CARE_PROVIDER_SITE_OTHER): Payer: Self-pay | Admitting: *Deleted

## 2012-12-01 ENCOUNTER — Encounter (HOSPITAL_COMMUNITY): Payer: Self-pay | Admitting: Internal Medicine

## 2013-07-15 ENCOUNTER — Other Ambulatory Visit: Payer: Self-pay

## 2013-07-19 ENCOUNTER — Other Ambulatory Visit (HOSPITAL_COMMUNITY): Payer: Self-pay | Admitting: Obstetrics and Gynecology

## 2013-07-19 DIAGNOSIS — Z139 Encounter for screening, unspecified: Secondary | ICD-10-CM

## 2013-08-20 ENCOUNTER — Ambulatory Visit (HOSPITAL_COMMUNITY)
Admission: RE | Admit: 2013-08-20 | Discharge: 2013-08-20 | Disposition: A | Discharge status: Home / Self Care | Payer: 59 | Source: Ambulatory Visit | Attending: Obstetrics and Gynecology | Admitting: Obstetrics and Gynecology

## 2013-08-20 DIAGNOSIS — Z139 Encounter for screening, unspecified: Secondary | ICD-10-CM

## 2013-08-20 DIAGNOSIS — Z1231 Encounter for screening mammogram for malignant neoplasm of breast: Secondary | ICD-10-CM | POA: Insufficient documentation

## 2014-04-05 ENCOUNTER — Emergency Department (HOSPITAL_COMMUNITY)
Admission: EM | Admit: 2014-04-05 | Discharge: 2014-04-05 | Disposition: A | Discharge status: Home / Self Care | Payer: PRIVATE HEALTH INSURANCE | Attending: Emergency Medicine | Admitting: Emergency Medicine

## 2014-04-05 ENCOUNTER — Emergency Department (HOSPITAL_COMMUNITY): Payer: PRIVATE HEALTH INSURANCE

## 2014-04-05 ENCOUNTER — Encounter (HOSPITAL_COMMUNITY): Payer: Self-pay | Admitting: Emergency Medicine

## 2014-04-05 DIAGNOSIS — S6990XA Unspecified injury of unspecified wrist, hand and finger(s), initial encounter: Secondary | ICD-10-CM

## 2014-04-05 DIAGNOSIS — S40019A Contusion of unspecified shoulder, initial encounter: Secondary | ICD-10-CM | POA: Insufficient documentation

## 2014-04-05 DIAGNOSIS — S9030XA Contusion of unspecified foot, initial encounter: Secondary | ICD-10-CM | POA: Insufficient documentation

## 2014-04-05 DIAGNOSIS — K219 Gastro-esophageal reflux disease without esophagitis: Secondary | ICD-10-CM | POA: Insufficient documentation

## 2014-04-05 DIAGNOSIS — S59909A Unspecified injury of unspecified elbow, initial encounter: Secondary | ICD-10-CM | POA: Insufficient documentation

## 2014-04-05 DIAGNOSIS — S9032XA Contusion of left foot, initial encounter: Secondary | ICD-10-CM

## 2014-04-05 DIAGNOSIS — Z8669 Personal history of other diseases of the nervous system and sense organs: Secondary | ICD-10-CM | POA: Insufficient documentation

## 2014-04-05 DIAGNOSIS — S5000XA Contusion of unspecified elbow, initial encounter: Secondary | ICD-10-CM | POA: Insufficient documentation

## 2014-04-05 DIAGNOSIS — Z87891 Personal history of nicotine dependence: Secondary | ICD-10-CM | POA: Insufficient documentation

## 2014-04-05 DIAGNOSIS — IMO0001 Reserved for inherently not codable concepts without codable children: Secondary | ICD-10-CM

## 2014-04-05 DIAGNOSIS — Y9389 Activity, other specified: Secondary | ICD-10-CM | POA: Insufficient documentation

## 2014-04-05 DIAGNOSIS — S59919A Unspecified injury of unspecified forearm, initial encounter: Secondary | ICD-10-CM

## 2014-04-05 DIAGNOSIS — R296 Repeated falls: Secondary | ICD-10-CM | POA: Insufficient documentation

## 2014-04-05 DIAGNOSIS — S5001XA Contusion of right elbow, initial encounter: Secondary | ICD-10-CM

## 2014-04-05 DIAGNOSIS — Y9289 Other specified places as the place of occurrence of the external cause: Secondary | ICD-10-CM | POA: Insufficient documentation

## 2014-04-05 DIAGNOSIS — Y990 Civilian activity done for income or pay: Secondary | ICD-10-CM | POA: Insufficient documentation

## 2014-04-05 DIAGNOSIS — Z3202 Encounter for pregnancy test, result negative: Secondary | ICD-10-CM | POA: Insufficient documentation

## 2014-04-05 DIAGNOSIS — S40011A Contusion of right shoulder, initial encounter: Secondary | ICD-10-CM

## 2014-04-05 DIAGNOSIS — S40021A Contusion of right upper arm, initial encounter: Secondary | ICD-10-CM

## 2014-04-05 LAB — POC URINE PREG, ED: PREG TEST UR: NEGATIVE

## 2014-04-05 NOTE — ED Notes (Signed)
Pt is an employee of [employer], was leaving work and fell on rt elbow, shoulder. Pt also co lt top of foot pain.

## 2014-04-05 NOTE — Discharge Instructions (Signed)
Take ibuprofen as needed for pain. Return for worsening symptoms.

## 2014-04-05 NOTE — ED Provider Notes (Signed)
CSN: [id number]     Arrival date & time 04/05/14  1730 History   First MD Initiated Contact with Patient 04/05/14 1859     Chief Complaint  Patient presents with  . Fall    rt elbow     (Consider location/radiation/quality/duration/timing/severity/associated sxs/prior Treatment) Patient is a 43 y.o. female presenting with fall. The history is provided by the patient.  Fall This is a new problem. The current episode started today. The problem occurs constantly. The problem has been unchanged. The symptoms are aggravated by standing and walking. She has tried nothing for the symptoms.   [name] is a 43 y.o. female who presents to the ED with pain to the right elbow, shoulder and top of left foot s/p fall. 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 She fell on her right side. She denies LOC or head injury. She denies any other injuries.   Past Medical History  Diagnosis Date  . Irritable bowel syndrome   . GERD (gastroesophageal reflux disease)   . Restless leg syndrome    Past Surgical History  Procedure Laterality Date  . Rectocele repair  2009  . Upper gastrointestinal endoscopy  2006  . Cesarean section  2004  . Colonoscopy N/A 11/27/2012    Procedure: COLONOSCOPY;  Surgeon: Rogene Houston, MD;  [facility];  Service: Endoscopy;  Laterality: N/A;  930   Family History  Problem Relation Age of Onset  . Healthy Mother   . Hypertension Father   . Healthy Sister   . Healthy Brother   . Healthy Daughter   . Healthy Son   . Colon cancer Other    History  Substance Use Topics  . Smoking status: Former Smoker    Types: Cigarettes    Quit date: 07/28/2006  . Smokeless tobacco: Never Used     Comment: patient smoked 1 pack every 2 weeks  . Alcohol Use: Yes     Comment: Social   OB History   Grav Para Term Preterm Abortions TAB SAB Ect Mult Living                 Review of  Systems Negative except as stated in HPI   Allergies  Review of patient's allergies indicates no known allergies.  Home Medications   Prior to Admission medications   Medication Sig Start Date End Date Taking? Authorizing Provider  ibuprofen (ADVIL,MOTRIN) 800 MG tablet Take 800 mg by mouth every 8 (eight) hours as needed for pain.    Historical Provider, MD  ranitidine (ZANTAC) 150 MG tablet Take 1 tablet (150 mg total) by mouth 2 (two) times daily. 08/24/12   Rogene Houston, MD  rOPINIRole (REQUIP) 0.5 MG tablet Take 0.5 mg by mouth at bedtime as needed.     Historical Provider, MD   BP 93/66  Pulse 63  Temp(Src) 98.7 F (37.1 C) (Oral)  Resp 18  Ht 5\' 10"  (1.778 m)  Wt 210 lb (95.255 kg)  BMI 30.13 kg/m2  SpO2 100%  LMP 02/03/2014 Physical Exam  Nursing note and vitals reviewed. Constitutional: She is oriented to person, place, and time. She appears well-developed and well-nourished. No distress.  HENT:  Head: Normocephalic and atraumatic.  Eyes: Conjunctivae and EOM are normal.  Neck: Normal range of motion. Neck supple.  Cardiovascular: Normal rate and regular rhythm.   Pulmonary/Chest: Effort normal and breath sounds  normal. She exhibits no tenderness.  Abdominal: Soft. There is no tenderness.  Musculoskeletal:       Right elbow: She exhibits swelling. She exhibits normal range of motion, no deformity and no laceration. Tenderness found.       Arms:      Left foot: She exhibits tenderness and swelling. She exhibits normal range of motion, no deformity and no laceration.       Feet:  Radial and pedal pulses strong, adequate circulation, good touch sensation. Tender with palpation over the right shoulder and elbow and to the lateral aspect of the left foot.  Neurological: She is alert and oriented to person, place, and time. No cranial nerve deficit.  Skin: Skin is warm and dry.  Psychiatric: She has a normal mood and affect. Her behavior is normal.    ED Course    Procedures (including critical care time) Labs Review Labs Reviewed  POC URINE PREG, ED    Imaging Review Dg Shoulder Right  04/05/2014   CLINICAL DATA:  FALL  EXAM: RIGHT SHOULDER - 2+ VIEW  COMPARISON:  None.  FINDINGS: No acute fracture or dislocation. Humeral head is normally aligned within the glenoid. AC joint is approximated. Mild degenerative spurring present about the [facility] joint. No periarticular calcification.  IMPRESSION: No acute traumatic injury about the shoulder   Electronically Signed   By: Jeannine Boga M.D.   On: 04/05/2014 18:38   Dg Elbow Complete Right  04/05/2014   CLINICAL DATA:  Trauma and pain.  EXAM: RIGHT ELBOW - COMPLETE 3+ VIEW  COMPARISON:  None.  FINDINGS: No acute fracture or dislocation.  No joint effusion.  IMPRESSION: No acute osseous abnormality.   Electronically Signed   By: Abigail Miyamoto M.D.   On: 04/05/2014 18:37   Dg Foot Complete Left  04/05/2014   CLINICAL DATA:  Trauma and pain.  EXAM: LEFT FOOT - COMPLETE 3+ VIEW  COMPARISON:  None.  FINDINGS: Small calcaneal spur. No acute fracture or dislocation. Suspect mild soft tissue swelling about the lateral forefoot.  IMPRESSION: No acute osseous abnormality.   Electronically Signed   By: Abigail Miyamoto M.D.   On: 04/05/2014 18:39     MDM  43 y.o. female with pain over the right shoulder and elbow and left foot s/p fall when leaving work here today. She declined pain medication and states she will take OTC medication as needed. She will apply ice and ace wrap if she feels it is necessary. Stable for discharge without neurovascular deficits.     [place], [place] 04/06/14 [phone]

## 2014-04-06 NOTE — ED Provider Notes (Signed)
  Medical screening examination/treatment/procedure(s) were performed by non-physician practitioner and as supervising physician I was immediately available for consultation/collaboration.   EKG Interpretation None         Carmin Muskrat, MD 04/06/14 [phone]

## 2014-04-21 ENCOUNTER — Other Ambulatory Visit: Payer: Self-pay | Admitting: Dermatology

## 2014-08-29 ENCOUNTER — Other Ambulatory Visit (HOSPITAL_COMMUNITY): Payer: Self-pay | Admitting: Obstetrics and Gynecology

## 2014-08-29 DIAGNOSIS — Z1231 Encounter for screening mammogram for malignant neoplasm of breast: Secondary | ICD-10-CM

## 2014-09-05 ENCOUNTER — Ambulatory Visit (HOSPITAL_COMMUNITY)
Admission: RE | Admit: 2014-09-05 | Discharge: 2014-09-05 | Disposition: A | Discharge status: Home / Self Care | Payer: 59 | Source: Ambulatory Visit | Attending: Obstetrics and Gynecology | Admitting: Obstetrics and Gynecology

## 2014-09-05 DIAGNOSIS — Z1231 Encounter for screening mammogram for malignant neoplasm of breast: Secondary | ICD-10-CM | POA: Diagnosis not present

## 2014-11-25 ENCOUNTER — Other Ambulatory Visit: Payer: Self-pay | Admitting: Obstetrics and Gynecology

## 2014-11-28 LAB — CYTOLOGY - PAP

## 2014-12-02 ENCOUNTER — Ambulatory Visit (INDEPENDENT_AMBULATORY_CARE_PROVIDER_SITE_OTHER): Payer: 59 | Admitting: Cardiovascular Disease

## 2014-12-02 ENCOUNTER — Encounter: Payer: Self-pay | Admitting: Cardiovascular Disease

## 2014-12-02 VITALS — BP 106/68 | HR 71 | Ht 70.0 in | Wt 226.0 lb

## 2014-12-02 DIAGNOSIS — R0683 Snoring: Secondary | ICD-10-CM

## 2014-12-02 DIAGNOSIS — R002 Palpitations: Secondary | ICD-10-CM | POA: Diagnosis not present

## 2014-12-02 DIAGNOSIS — R0609 Other forms of dyspnea: Secondary | ICD-10-CM

## 2014-12-02 NOTE — Progress Notes (Signed)
Patient ID: [name], female   DOB: 19-Jul-1971, 44 y.o.   MRN: [id number]       CARDIOLOGY CONSULT NOTE  Patient ID: [name] MRN: [id number] DOB/AGE: [birth year] 44 y.o.  Admit date: (Not on file) Primary Physician Asencion Noble, MD  Reason for Consultation: palpitations  HPI: The patient is a 44 yr old woman with a past medical history significant for IBS, GERD, and restless leg syndrome. Blood tests from 01/29/14 reviewed and demonstrated normal urinalysis, CBC, basic metabolic panel, LFT's, and lipid panel other than low HDL of 37. She has been experiencing palpitations for the past 5 years, but they have become more noticeable in the past 6 months. This past Saturday while sitting and watching her son's baseball practice, she began to experience the sudden onset of palpitations. She had some associated retrosternal chest discomfort, shortness of breath, and dizziness. She was scared and almost went to the ED. She denies any recent or distant syncopal episodes. She also denies exertional chest pain, orthopnea, leg swelling, and paroxysmal nocturnal dyspnea.  She has a chronic left lower quadrant abdominal discomfort but she has followed with Dr. Laural Golden in GI and there have been no readily identifiable abnormalities. She said a very recent thyroid blood test checked by her gynecologist was normal. She has not had a menstrual cycle since July, and her mother also went through menopause at the same age. She has put on roughly 15 lbs in the past year and says she has not been exercising as much.  She drinks 2 cups of coffee every morning and avoids soft drinks. If she drinks more than this, she can feel her heart race. Her symptoms occur about twice per week and may last several minutes. Her daughter is a former cardiology office nurse (apparently worked with Dr. Lattie Haw) and she has told [facility] that she has PAC's. The patient has never had an echocardiogram or worn a cardiac monitor. She  is planning on going to [facility] with her daughter next week for a choral group trip. ECG performed in the office today demonstrates normal sinus rhythm, HR 63 bpm, with no ischemic ST-T abnormalities nor any arrhythmias.  Soc: Married. 2 children (15 and 12). Prior smoker. 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   No Known Allergies  Current Outpatient Prescriptions  Medication Sig Dispense Refill  . ibuprofen (ADVIL,MOTRIN) 800 MG tablet Take 800 mg by mouth every 8 (eight) hours as needed for pain.    . ranitidine (ZANTAC) 150 MG tablet Take 1 tablet (150 mg total) by mouth 2 (two) times daily. 180 tablet 3  . rOPINIRole (REQUIP) 0.5 MG tablet Take 0.5 mg by mouth at bedtime as needed.      No current facility-administered medications for this visit.    Past Medical History  Diagnosis Date  . Irritable bowel syndrome   . GERD (gastroesophageal reflux disease)   . Restless leg syndrome     Past Surgical History  Procedure Laterality Date  . Rectocele repair  2009  . Upper gastrointestinal endoscopy  2006  . Cesarean section  2004  . Colonoscopy N/A 11/27/2012    Procedure: COLONOSCOPY;  Surgeon: Rogene Houston, MD;  [facility];  Service: Endoscopy;  Laterality: N/A;  930    History   Social History  . Marital Status: Married    Spouse Name: N/A  . Number of Children: N/A  . Years of Education: N/A   Occupational History  .  Not on file.   Social History Main Topics  . Smoking status: Former Smoker    Types: Cigarettes    Quit date: 07/28/2006  . Smokeless tobacco: Never Used     Comment: patient smoked 1 pack every 2 weeks  . Alcohol Use: Yes     Comment: Social  . Drug Use: No  . Sexual Activity: Not on file   Other Topics Concern  . Not on file   Social History Narrative  . No narrative on file     No family history of premature CAD in 1st degree relatives.  Prior to Admission medications   Medication Sig Start  Date End Date Taking? Authorizing Provider  ibuprofen (ADVIL,MOTRIN) 800 MG tablet Take 800 mg by mouth every 8 (eight) hours as needed for pain.    Historical Provider, MD  ranitidine (ZANTAC) 150 MG tablet Take 1 tablet (150 mg total) by mouth 2 (two) times daily. 08/24/12   Rogene Houston, MD  rOPINIRole (REQUIP) 0.5 MG tablet Take 0.5 mg by mouth at bedtime as needed.     Historical Provider, MD     Review of systems complete and found to be negative unless listed above in HPI     Physical exam BP 106/68  Pulse 71  SpO2 98%  Weight 226 lb (102.513 kg) Height 5\' 10"  (1.778 m)   General: NAD Neck: No JVD, no thyromegaly or thyroid nodule.  Lungs: Clear to auscultation bilaterally with normal respiratory effort. CV: Nondisplaced PMI. Regular rate and rhythm, normal S1/S2, no S3/S4, no murmur.  No peripheral edema.  No carotid bruit.  Normal pedal pulses.  Abdomen: Soft, mild LLQ tenderness, no hepatosplenomegaly, no distention.  Skin: Intact without lesions or rashes.  Neurologic: Alert and oriented x 3.  Psych: Normal affect. Extremities: No clubbing or cyanosis.  HEENT: Normal.   ECG: Most recent ECG reviewed.  Labs:   Lab Results  Component Value Date   WBC 7.7 08/25/2012   HGB 15.8* 08/25/2012   HCT 45.2 08/25/2012   MCV 85.3 08/25/2012   PLT 247 08/25/2012   No results for input(s): NA, K, CL, CO2, BUN, CREATININE, CALCIUM, PROT, BILITOT, ALKPHOS, ALT, AST, GLUCOSE in the last 168 hours.  Invalid input(s): LABALBU No results found for: CKTOTAL, CKMB, CKMBINDEX, TROPONINI No results found for: CHOL No results found for: HDL No results found for: LDLCALC No results found for: TRIG No results found for: CHOLHDL No results found for: LDLDIRECT       Studies: No results found.  ASSESSMENT AND PLAN:  1. Palpitations and progressive exertional dyspnea: She may be experiencing symptomatic premature contractions or non-sustained tachyarrhythmias. I will obtain  an echocardiogram to evaluate for structural heart disease as well as a 2-week event monitor. I will also obtain a CBC, BMET, and serum magnesium to evaluate for readily treatable causes. TSH was reportedly normal.  2. Snoring and weight gain: I would consider evaluating her for sleep apnea as this is an etiology for arrhythmias such as atrial fibrillation, albeit less likely as she does not have hypertension and because of her age.  Dispo: f/u 4-5 weeks.  Signed: Kate Sable, M.D., F.A.C.C.  12/02/2014, 3:06 PM

## 2014-12-02 NOTE — Patient Instructions (Signed)
Your physician recommends that you schedule a follow-up appointment in: 4-5 weeks    Please get lab work (CBC,BMET,Magnesium)    Your physician has recommended that you wear an event monitor. Event monitors are medical devices that record the heart's electrical activity. Doctors most often [place] these monitors to diagnose arrhythmias. Arrhythmias are problems with the speed or rhythm of the heartbeat. The monitor is a small, portable device. You can wear one while you do your normal daily activities. This is usually used to diagnose what is causing palpitations/syncope (passing out).   Your physician has requested that you have an echocardiogram. Echocardiography is a painless test that uses sound waves to create images of your heart. It provides your doctor with information about the size and shape of your heart and how well your heart's chambers and valves are working. This procedure takes approximately one hour. There are no restrictions for this procedure.      Thank you for choosing [facility] !

## 2014-12-12 ENCOUNTER — Ambulatory Visit (HOSPITAL_COMMUNITY)
Admission: RE | Admit: 2014-12-12 | Discharge: 2014-12-12 | Disposition: A | Discharge status: Home / Self Care | Payer: 59 | Source: Ambulatory Visit | Attending: Cardiovascular Disease | Admitting: Cardiovascular Disease

## 2014-12-12 DIAGNOSIS — R0609 Other forms of dyspnea: Secondary | ICD-10-CM

## 2014-12-12 DIAGNOSIS — R002 Palpitations: Secondary | ICD-10-CM | POA: Insufficient documentation

## 2014-12-12 NOTE — Progress Notes (Signed)
  Echocardiogram 2D Echocardiogram has been performed.  [name] 12/12/2014, 11:32 AM

## 2014-12-13 LAB — BASIC METABOLIC PANEL
BUN: 13 mg/dL (ref 6–23)
CHLORIDE: 105 meq/L (ref 96–112)
CO2: 30 meq/L (ref 19–32)
CREATININE: 0.76 mg/dL (ref 0.50–1.10)
Calcium: 9.2 mg/dL (ref 8.4–10.5)
Glucose, Bld: 84 mg/dL (ref 70–99)
POTASSIUM: 4.4 meq/L (ref 3.5–5.3)
Sodium: 138 mEq/L (ref 135–145)

## 2014-12-13 LAB — CBC
HEMATOCRIT: 38.9 % (ref 36.0–46.0)
HEMOGLOBIN: 13.3 g/dL (ref 12.0–15.0)
MCH: 30 pg (ref 26.0–34.0)
MCHC: 34.2 g/dL (ref 30.0–36.0)
MCV: 87.8 fL (ref 78.0–100.0)
MPV: 9.2 fL (ref 8.6–12.4)
Platelets: 195 10*3/uL (ref 150–400)
RBC: 4.43 MIL/uL (ref 3.87–5.11)
RDW: 13.3 % (ref 11.5–15.5)
WBC: 5.3 10*3/uL (ref 4.0–10.5)

## 2014-12-13 LAB — MAGNESIUM: MAGNESIUM: 1.9 mg/dL (ref 1.5–2.5)

## 2014-12-19 ENCOUNTER — Other Ambulatory Visit: Payer: Self-pay

## 2014-12-19 ENCOUNTER — Telehealth: Payer: Self-pay | Admitting: *Deleted

## 2014-12-19 DIAGNOSIS — R002 Palpitations: Secondary | ICD-10-CM

## 2014-12-19 DIAGNOSIS — R0609 Other forms of dyspnea: Secondary | ICD-10-CM

## 2014-12-19 NOTE — Telephone Encounter (Signed)
EOS Report placed in Dr. Bronson Ing folder.

## 2015-01-13 ENCOUNTER — Encounter: Payer: Self-pay | Admitting: Cardiovascular Disease

## 2015-01-13 ENCOUNTER — Ambulatory Visit (INDEPENDENT_AMBULATORY_CARE_PROVIDER_SITE_OTHER): Payer: 59 | Admitting: Cardiovascular Disease

## 2015-01-13 VITALS — BP 116/80 | HR 92 | Ht 70.0 in | Wt 226.0 lb

## 2015-01-13 DIAGNOSIS — R0683 Snoring: Secondary | ICD-10-CM

## 2015-01-13 DIAGNOSIS — R0609 Other forms of dyspnea: Secondary | ICD-10-CM | POA: Diagnosis not present

## 2015-01-13 DIAGNOSIS — R221 Localized swelling, mass and lump, neck: Secondary | ICD-10-CM

## 2015-01-13 DIAGNOSIS — R002 Palpitations: Secondary | ICD-10-CM | POA: Diagnosis not present

## 2015-01-13 NOTE — Progress Notes (Signed)
Patient ID: [name], female   DOB:  [birth year], 44 y.o.   MRN: [id number]      SUBJECTIVE: The patient returns for follow-up after undergoing cardiovascular testing performed for the evaluation of palpitations and dyspnea. CBC, magnesium, and BMET were normal.  Event monitoring showed sinus rhythm, isolated PVC, and sinus tachcyardia, with most symptoms correlating with sinus rhythm.  Echocardiogram showed normal LV systolic and diastolic function, EF 44-03%, with mild tricuspid regurgitation.  She is feeling much better and denies chest pain and palpitations. She wonders about some mild neck enlargement but denies hoarseness of voice, dry skin, and chronic constipation.     Review of Systems: As per "subjective", otherwise negative.  No Known Allergies  Current Outpatient Prescriptions  Medication Sig Dispense Refill  . ibuprofen (ADVIL,MOTRIN) 800 MG tablet Take 800 mg by mouth every 8 (eight) hours as needed for pain.    . ranitidine (ZANTAC) 150 MG tablet Take 1 tablet (150 mg total) by mouth 2 (two) times daily. 180 tablet 3  . rOPINIRole (REQUIP) 0.5 MG tablet Take 0.5 mg by mouth at bedtime as needed.      No current facility-administered medications for this visit.    Past Medical History  Diagnosis Date  . Irritable bowel syndrome   . GERD (gastroesophageal reflux disease)   . Restless leg syndrome     Past Surgical History  Procedure Laterality Date  . Rectocele repair  2009  . Upper gastrointestinal endoscopy  2006  . Cesarean section  2004  . Colonoscopy N/A 11/27/2012    Procedure: COLONOSCOPY;  Surgeon: Rogene Houston, MD;  [facility];  Service: Endoscopy;  Laterality: N/A;  930    History   Social History  . Marital Status: Married    Spouse Name: N/A  . Number of Children: N/A  . Years of Education: N/A   Occupational History  . Not on file.   Social History Main Topics  . Smoking status: Former Smoker -- .5 years    Types:  Cigarettes    Start date: 12/01/1984    Quit date: 07/28/2006  . Smokeless tobacco: Never Used     Comment: patient smoked 1 pack every 2 weeks  . Alcohol Use: 0.0 oz/week    0 Standard drinks or equivalent per week     Comment: Social  . Drug Use: No  . Sexual Activity: Not on file   Other Topics Concern  . Not on file   Social History Narrative     Filed Vitals:   01/13/15 0827  BP: 116/80  Pulse: 92  Height: 5\' 10"  (1.778 m)  Weight: 226 lb (102.513 kg)  SpO2: 97%    PHYSICAL EXAM General: NAD HEENT: Normal. Neck: No JVD, no thyromegaly. Lungs: Clear to auscultation bilaterally with normal respiratory effort. CV: Nondisplaced PMI.  Regular rate and rhythm, normal S1/S2, no S3/S4, no murmur. No pretibial or periankle edema.  No carotid bruit.  Normal pedal pulses.  Abdomen: Soft, nontender, no hepatosplenomegaly, no distention.  Neurologic: Alert and oriented x 3.  Psych: Normal affect. Skin: Normal. Musculoskeletal: Normal range of motion, no gross deformities. Extremities: No clubbing or cyanosis.   ECG: Most recent ECG reviewed.      ASSESSMENT AND PLAN: 1. Palpitations and progressive exertional dyspnea: Symptomatically stable with normal test results. Will monitor. Does not require medical therapy at this time.  2. Snoring and weight gain: I would consider evaluating her for sleep apnea as this is  an etiology for arrhythmias such as atrial fibrillation, albeit less likely as she does not have hypertension and because of her age.  3. Throat enlargement: No palpable goiter. Would consider thyroid ultrasound in future if it enlarges further.  Dispo: f/u prn.   Kate Sable, M.D., F.A.C.C.

## 2015-01-13 NOTE — Patient Instructions (Signed)
Your physician recommends that you schedule a follow-up appointment in: As Needed  Your physician recommends that you continue on your current medications as directed. Please refer to the Current Medication list given to you today.  Thank you for choosing [facility]!    

## 2015-08-01 ENCOUNTER — Other Ambulatory Visit (HOSPITAL_COMMUNITY): Payer: Self-pay | Admitting: Obstetrics and Gynecology

## 2015-08-01 DIAGNOSIS — Z1231 Encounter for screening mammogram for malignant neoplasm of breast: Secondary | ICD-10-CM

## 2015-09-07 ENCOUNTER — Ambulatory Visit (HOSPITAL_COMMUNITY)
Admission: RE | Admit: 2015-09-07 | Discharge: 2015-09-07 | Disposition: A | Discharge status: Home / Self Care | Payer: 59 | Source: Ambulatory Visit | Attending: Obstetrics and Gynecology | Admitting: Obstetrics and Gynecology

## 2015-09-07 DIAGNOSIS — Z1231 Encounter for screening mammogram for malignant neoplasm of breast: Secondary | ICD-10-CM | POA: Diagnosis not present

## 2015-11-28 DIAGNOSIS — J111 Influenza due to unidentified influenza virus with other respiratory manifestations: Secondary | ICD-10-CM | POA: Diagnosis not present

## 2015-12-15 DIAGNOSIS — N912 Amenorrhea, unspecified: Secondary | ICD-10-CM | POA: Diagnosis not present

## 2015-12-15 DIAGNOSIS — Z6833 Body mass index (BMI) 33.0-33.9, adult: Secondary | ICD-10-CM | POA: Diagnosis not present

## 2015-12-15 DIAGNOSIS — N910 Primary amenorrhea: Secondary | ICD-10-CM | POA: Diagnosis not present

## 2015-12-15 DIAGNOSIS — Z01419 Encounter for gynecological examination (general) (routine) without abnormal findings: Secondary | ICD-10-CM | POA: Diagnosis not present

## 2015-12-19 MED FILL — MEDROXYPROGESTERONE 10 MG T: 10 | 30 days supply | Qty: 10 | Fill #0

## 2016-04-16 ENCOUNTER — Telehealth (INDEPENDENT_AMBULATORY_CARE_PROVIDER_SITE_OTHER): Payer: Self-pay | Admitting: Internal Medicine

## 2016-04-16 NOTE — Telephone Encounter (Signed)
Patient called, would like a refill on her acid reflux medication, however she's not sure if she needs to call the pharmacy or not because it has been at least 2 years.  She stated that the medication she had had lasted her this long.  [phone] work [phone] cell

## 2016-04-18 ENCOUNTER — Other Ambulatory Visit (INDEPENDENT_AMBULATORY_CARE_PROVIDER_SITE_OTHER): Payer: Self-pay | Admitting: *Deleted

## 2016-04-18 DIAGNOSIS — K219 Gastro-esophageal reflux disease without esophagitis: Secondary | ICD-10-CM

## 2016-04-18 MED ORDER — RANITIDINE HCL 150 MG PO TABS: 150.0000 mg | ORAL_TABLET | Freq: Two times a day (BID) | ORAL | 3 refills | Status: DC

## 2016-04-18 MED FILL — raNITIdine HCL 150 MG TABS: 150 | 90 days supply | Qty: 180 | Fill #0

## 2016-04-18 NOTE — Telephone Encounter (Signed)
After talking with the patient , she ask that we send in a Rx for the Ranitidine to [facility]. This has been done and the patient is aware.

## 2016-04-18 NOTE — Telephone Encounter (Signed)
Patient was called on her home number. I ask that she call me as we need to follow up on her GI symptoms, I shared that Dr.Rehman had commented that if she is taking on a PRN basis that she could get them (Ranitidine) OTC and take them.

## 2016-07-11 ENCOUNTER — Other Ambulatory Visit (HOSPITAL_COMMUNITY): Payer: Self-pay | Admitting: Obstetrics and Gynecology

## 2016-07-11 DIAGNOSIS — Z1231 Encounter for screening mammogram for malignant neoplasm of breast: Secondary | ICD-10-CM

## 2016-09-06 ENCOUNTER — Ambulatory Visit (HOSPITAL_COMMUNITY)
Admission: RE | Admit: 2016-09-06 | Discharge: 2016-09-06 | Disposition: A | Discharge status: Home / Self Care | Payer: 59 | Source: Ambulatory Visit | Attending: Obstetrics and Gynecology | Admitting: Obstetrics and Gynecology

## 2016-09-06 DIAGNOSIS — Z1231 Encounter for screening mammogram for malignant neoplasm of breast: Secondary | ICD-10-CM | POA: Insufficient documentation

## 2016-12-20 DIAGNOSIS — Z6834 Body mass index (BMI) 34.0-34.9, adult: Secondary | ICD-10-CM | POA: Diagnosis not present

## 2016-12-20 DIAGNOSIS — Z01419 Encounter for gynecological examination (general) (routine) without abnormal findings: Secondary | ICD-10-CM | POA: Diagnosis not present

## 2017-01-24 DIAGNOSIS — Z0000 Encounter for general adult medical examination without abnormal findings: Secondary | ICD-10-CM | POA: Diagnosis not present

## 2017-01-31 DIAGNOSIS — R002 Palpitations: Secondary | ICD-10-CM | POA: Diagnosis not present

## 2017-01-31 DIAGNOSIS — R740 Nonspecific elevation of levels of transaminase and lactic acid dehydrogenase [LDH]: Secondary | ICD-10-CM | POA: Diagnosis not present

## 2017-01-31 DIAGNOSIS — Z0001 Encounter for general adult medical examination with abnormal findings: Secondary | ICD-10-CM | POA: Diagnosis not present

## 2017-01-31 DIAGNOSIS — Z6834 Body mass index (BMI) 34.0-34.9, adult: Secondary | ICD-10-CM | POA: Diagnosis not present

## 2017-03-10 MED FILL — raNITIdine HCL 150 MG TABS: 150 | 90 days supply | Qty: 180 | Fill #1

## 2017-05-16 DIAGNOSIS — R945 Abnormal results of liver function studies: Secondary | ICD-10-CM | POA: Diagnosis not present

## 2017-08-12 ENCOUNTER — Other Ambulatory Visit (HOSPITAL_COMMUNITY): Payer: Self-pay | Admitting: Obstetrics and Gynecology

## 2017-08-12 DIAGNOSIS — Z1231 Encounter for screening mammogram for malignant neoplasm of breast: Secondary | ICD-10-CM

## 2017-09-08 ENCOUNTER — Ambulatory Visit (HOSPITAL_COMMUNITY)
Admission: RE | Admit: 2017-09-08 | Discharge: 2017-09-08 | Disposition: A | Discharge status: Home / Self Care | Payer: 59 | Source: Ambulatory Visit | Attending: Obstetrics and Gynecology | Admitting: Obstetrics and Gynecology

## 2017-09-08 DIAGNOSIS — Z1231 Encounter for screening mammogram for malignant neoplasm of breast: Secondary | ICD-10-CM | POA: Diagnosis not present

## 2017-11-11 ENCOUNTER — Telehealth (INDEPENDENT_AMBULATORY_CARE_PROVIDER_SITE_OTHER): Payer: Self-pay | Admitting: *Deleted

## 2017-11-11 ENCOUNTER — Other Ambulatory Visit (INDEPENDENT_AMBULATORY_CARE_PROVIDER_SITE_OTHER): Payer: Self-pay | Admitting: *Deleted

## 2017-11-11 DIAGNOSIS — K219 Gastro-esophageal reflux disease without esophagitis: Secondary | ICD-10-CM

## 2017-11-11 MED ORDER — RANITIDINE HCL 150 MG PO TABS: 150.0000 mg | ORAL_TABLET | Freq: Two times a day (BID) | ORAL | 3 refills | Status: DC

## 2017-11-11 MED FILL — raNITIdine HCL 150 MG TABS: 150 | 90 days supply | Qty: 180 | Fill #0

## 2017-11-11 NOTE — Telephone Encounter (Signed)
Patient has also scheduled an appointment with Terri for 12/12/17 at 10:00. Patient requested a Friday

## 2017-11-11 NOTE — Telephone Encounter (Signed)
Patient called requesting her zantac refilled to [facility]. Please advise

## 2017-11-11 NOTE — Telephone Encounter (Signed)
The medication has been e-scribed to [facility].

## 2017-12-12 ENCOUNTER — Telehealth (INDEPENDENT_AMBULATORY_CARE_PROVIDER_SITE_OTHER): Payer: Self-pay | Admitting: Internal Medicine

## 2017-12-12 ENCOUNTER — Encounter (INDEPENDENT_AMBULATORY_CARE_PROVIDER_SITE_OTHER): Payer: Self-pay | Admitting: Internal Medicine

## 2017-12-12 ENCOUNTER — Ambulatory Visit (INDEPENDENT_AMBULATORY_CARE_PROVIDER_SITE_OTHER): Payer: 59 | Admitting: Internal Medicine

## 2017-12-12 VITALS — BP 136/80 | HR 64 | Temp 98.3°F | Ht 70.0 in | Wt 226.7 lb

## 2017-12-12 DIAGNOSIS — Z86010 Personal history of colonic polyps: Secondary | ICD-10-CM

## 2017-12-12 DIAGNOSIS — R1012 Left upper quadrant pain: Secondary | ICD-10-CM | POA: Diagnosis not present

## 2017-12-12 DIAGNOSIS — K219 Gastro-esophageal reflux disease without esophagitis: Secondary | ICD-10-CM | POA: Diagnosis not present

## 2017-12-12 NOTE — Patient Instructions (Signed)
OV in 1 year.  

## 2017-12-12 NOTE — Progress Notes (Addendum)
   Subjective:    Patient ID: [name], female    DOB: 03/06/1971, 47 y.o.   MRN: [id number]  HPI Here today for f/u. Hx of GERD. Her acid reflux is controlled with Zantac. Her appetite is good. No weight loss. She takes the Zantac as needed (when she knows she is going to eat spicy foods).  BMs are normal. Hx of IBS. She alternates between constipation and diarrhea. Occasionally has LUQ tenderness.     Review of Systems Past Medical History:  Diagnosis Date  . GERD (gastroesophageal reflux disease)   . Irritable bowel syndrome   . Restless leg syndrome     Past Surgical History:  Procedure Laterality Date  . CESAREAN SECTION  2004  . COLONOSCOPY N/A 11/27/2012   Procedure: COLONOSCOPY;  Surgeon: Rogene Houston, MD;  [facility];  Service: Endoscopy;  Laterality: N/A;  [place]  2009  . UPPER GASTROINTESTINAL ENDOSCOPY  2006    No Known Allergies  Current Outpatient Medications on File Prior to Visit  Medication Sig Dispense Refill  . ranitidine (ZANTAC) 150 MG tablet Take 1 tablet (150 mg total) by mouth 2 (two) times daily. 180 tablet 3  . ibuprofen (ADVIL,MOTRIN) 800 MG tablet Take 800 mg by mouth every 8 (eight) hours as needed for pain.     No current facility-administered medications on file prior to visit.         Objective:   Physical Exam Blood pressure 136/80, pulse 64, temperature 98.3 F (36.8 C), height 5\' 10"  (1.778 m), weight 226 lb 11.2 oz (102.8 kg). Alert and oriented. Skin warm and dry. Oral mucosa is moist.   . Sclera anicteric, conjunctivae is pink. Thyroid not enlarged. No cervical lymphadenopathy. Lungs clear. Heart regular rate and rhythm.  Abdomen is soft. Bowel sounds are positive. No hepatomegaly. No abdominal masses felt. No tenderness.  No edema to lower extremities. Patient is alert and oriented.         Assessment & Plan:  GERD. She will continue the Zantac.Left upper quadrant pain: US abdomen.

## 2017-12-15 NOTE — Telephone Encounter (Signed)
err

## 2017-12-16 ENCOUNTER — Encounter (INDEPENDENT_AMBULATORY_CARE_PROVIDER_SITE_OTHER): Payer: Self-pay

## 2017-12-23 ENCOUNTER — Ambulatory Visit (HOSPITAL_COMMUNITY): Payer: 59

## 2017-12-26 ENCOUNTER — Ambulatory Visit (HOSPITAL_COMMUNITY)
Admission: RE | Admit: 2017-12-26 | Discharge: 2017-12-26 | Disposition: A | Discharge status: Home / Self Care | Payer: 59 | Source: Ambulatory Visit | Attending: Internal Medicine | Admitting: Internal Medicine

## 2017-12-26 DIAGNOSIS — K760 Fatty (change of) liver, not elsewhere classified: Secondary | ICD-10-CM | POA: Diagnosis not present

## 2017-12-26 DIAGNOSIS — R1012 Left upper quadrant pain: Secondary | ICD-10-CM | POA: Diagnosis not present

## 2018-01-05 DIAGNOSIS — Z6833 Body mass index (BMI) 33.0-33.9, adult: Secondary | ICD-10-CM | POA: Diagnosis not present

## 2018-01-05 DIAGNOSIS — Z01419 Encounter for gynecological examination (general) (routine) without abnormal findings: Secondary | ICD-10-CM | POA: Diagnosis not present

## 2018-01-05 DIAGNOSIS — N939 Abnormal uterine and vaginal bleeding, unspecified: Secondary | ICD-10-CM | POA: Diagnosis not present

## 2018-01-20 DIAGNOSIS — R9389 Abnormal findings on diagnostic imaging of other specified body structures: Secondary | ICD-10-CM | POA: Diagnosis not present

## 2018-01-20 DIAGNOSIS — N858 Other specified noninflammatory disorders of uterus: Secondary | ICD-10-CM | POA: Diagnosis not present

## 2018-01-20 DIAGNOSIS — R102 Pelvic and perineal pain: Secondary | ICD-10-CM | POA: Diagnosis not present

## 2018-01-20 DIAGNOSIS — N950 Postmenopausal bleeding: Secondary | ICD-10-CM | POA: Diagnosis not present

## 2018-02-11 MED FILL — PHENTERMINE 37.5 MG TABLET: 37.5 | 30 days supply | Qty: 30 | Fill #0

## 2018-05-12 MED FILL — raNITIdine HCL 150 MG TABS: 150 | 90 days supply | Qty: 180 | Fill #1

## 2018-08-04 DIAGNOSIS — H524 Presbyopia: Secondary | ICD-10-CM | POA: Diagnosis not present

## 2018-08-04 DIAGNOSIS — H5212 Myopia, left eye: Secondary | ICD-10-CM | POA: Diagnosis not present

## 2018-08-10 ENCOUNTER — Other Ambulatory Visit (HOSPITAL_COMMUNITY): Payer: Self-pay | Admitting: Obstetrics and Gynecology

## 2018-08-10 DIAGNOSIS — Z1231 Encounter for screening mammogram for malignant neoplasm of breast: Secondary | ICD-10-CM

## 2018-09-10 ENCOUNTER — Ambulatory Visit (HOSPITAL_COMMUNITY)
Admission: RE | Admit: 2018-09-10 | Discharge: 2018-09-10 | Disposition: A | Discharge status: Home / Self Care | Payer: 59 | Source: Ambulatory Visit | Attending: Obstetrics and Gynecology | Admitting: Obstetrics and Gynecology

## 2018-09-10 DIAGNOSIS — Z1231 Encounter for screening mammogram for malignant neoplasm of breast: Secondary | ICD-10-CM | POA: Insufficient documentation

## 2019-01-28 DIAGNOSIS — Z6834 Body mass index (BMI) 34.0-34.9, adult: Secondary | ICD-10-CM | POA: Diagnosis not present

## 2019-01-28 DIAGNOSIS — Z01419 Encounter for gynecological examination (general) (routine) without abnormal findings: Secondary | ICD-10-CM | POA: Diagnosis not present

## 2019-02-08 MED FILL — ALPRAZolam 0.25 MG TABS: 0.25 | 10 days supply | Qty: 30 | Fill #0

## 2019-06-28 ENCOUNTER — Other Ambulatory Visit: Payer: Self-pay

## 2019-07-28 ENCOUNTER — Other Ambulatory Visit (HOSPITAL_COMMUNITY): Payer: Self-pay | Admitting: Obstetrics and Gynecology

## 2019-07-28 DIAGNOSIS — Z1231 Encounter for screening mammogram for malignant neoplasm of breast: Secondary | ICD-10-CM

## 2019-09-17 ENCOUNTER — Ambulatory Visit (HOSPITAL_COMMUNITY): Payer: 59

## 2019-09-24 ENCOUNTER — Other Ambulatory Visit: Payer: Self-pay

## 2019-09-24 ENCOUNTER — Ambulatory Visit (HOSPITAL_COMMUNITY)
Admission: RE | Admit: 2019-09-24 | Discharge: 2019-09-24 | Disposition: A | Discharge status: Home / Self Care | Payer: 59 | Source: Ambulatory Visit | Attending: Obstetrics and Gynecology | Admitting: Obstetrics and Gynecology

## 2019-09-24 ENCOUNTER — Ambulatory Visit (HOSPITAL_COMMUNITY): Payer: 59

## 2019-09-24 ENCOUNTER — Other Ambulatory Visit: Payer: 59

## 2019-09-24 DIAGNOSIS — Z1231 Encounter for screening mammogram for malignant neoplasm of breast: Secondary | ICD-10-CM | POA: Diagnosis not present

## 2020-02-29 DIAGNOSIS — Z6833 Body mass index (BMI) 33.0-33.9, adult: Secondary | ICD-10-CM | POA: Diagnosis not present

## 2020-02-29 DIAGNOSIS — Z01419 Encounter for gynecological examination (general) (routine) without abnormal findings: Secondary | ICD-10-CM | POA: Diagnosis not present

## 2020-03-07 ENCOUNTER — Ambulatory Visit (HOSPITAL_COMMUNITY)
Admission: RE | Admit: 2020-03-07 | Discharge: 2020-03-07 | Disposition: A | Discharge status: Home / Self Care | Payer: 59 | Source: Ambulatory Visit | Attending: Internal Medicine | Admitting: Internal Medicine

## 2020-03-07 ENCOUNTER — Other Ambulatory Visit: Payer: Self-pay

## 2020-03-07 ENCOUNTER — Other Ambulatory Visit (HOSPITAL_COMMUNITY): Payer: Self-pay | Admitting: Internal Medicine

## 2020-03-07 DIAGNOSIS — R0781 Pleurodynia: Secondary | ICD-10-CM

## 2020-03-07 DIAGNOSIS — R079 Chest pain, unspecified: Secondary | ICD-10-CM | POA: Diagnosis not present

## 2020-05-22 IMAGING — MG DIGITAL SCREENING BILAT W/ TOMO W/ CAD
8 series · 8 of 24 positions shown · non-contrast
Comparison: Previous exam(s).

ACR Breast Density Category a: The breast tissue is almost entirely
fatty.

CLINICAL DATA: Screening.

EXAM:
DIGITAL SCREENING BILATERAL MAMMOGRAM WITH TOMO AND CAD

[R MLO synth-2D]
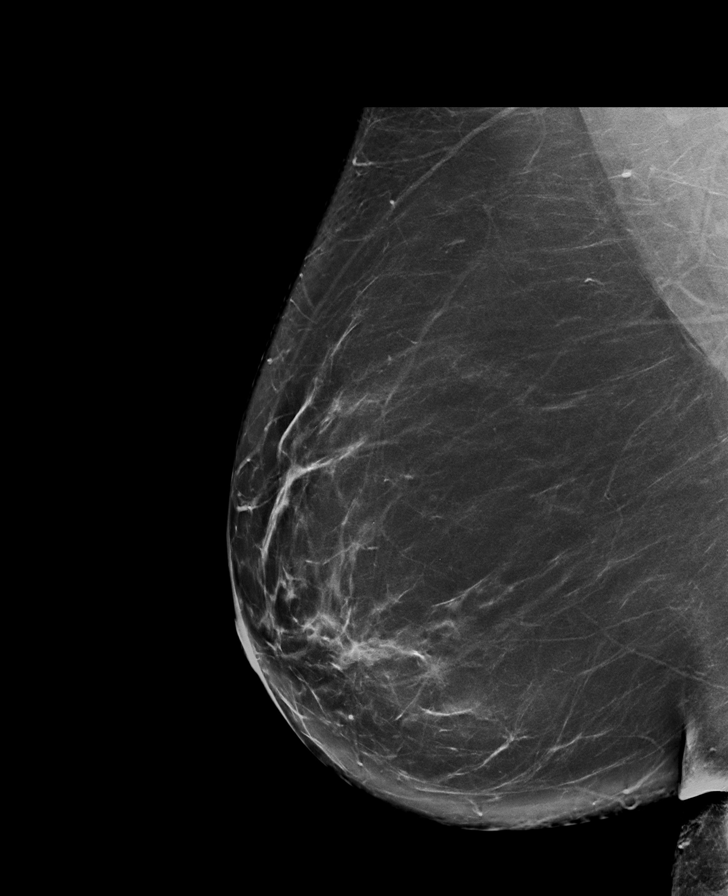

[L CC synth-2D]
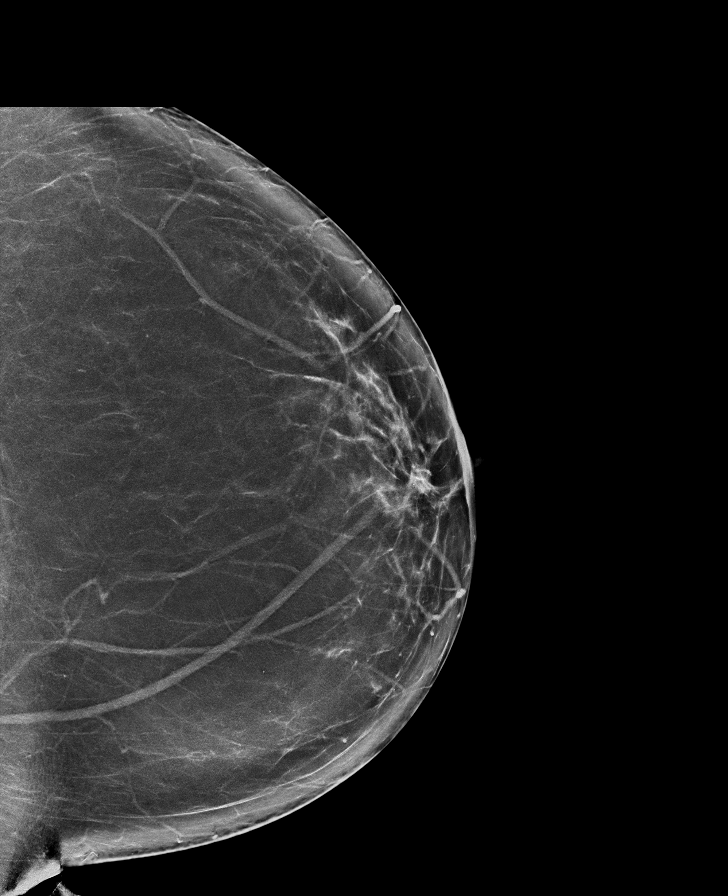

[R CC synth-2D]
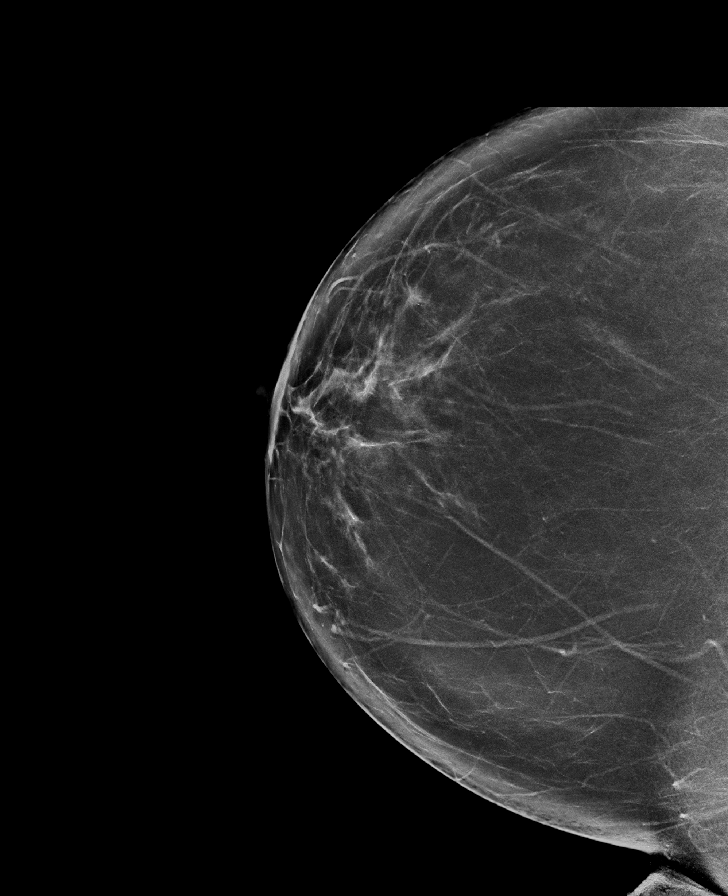

[L MLO synth-2D]
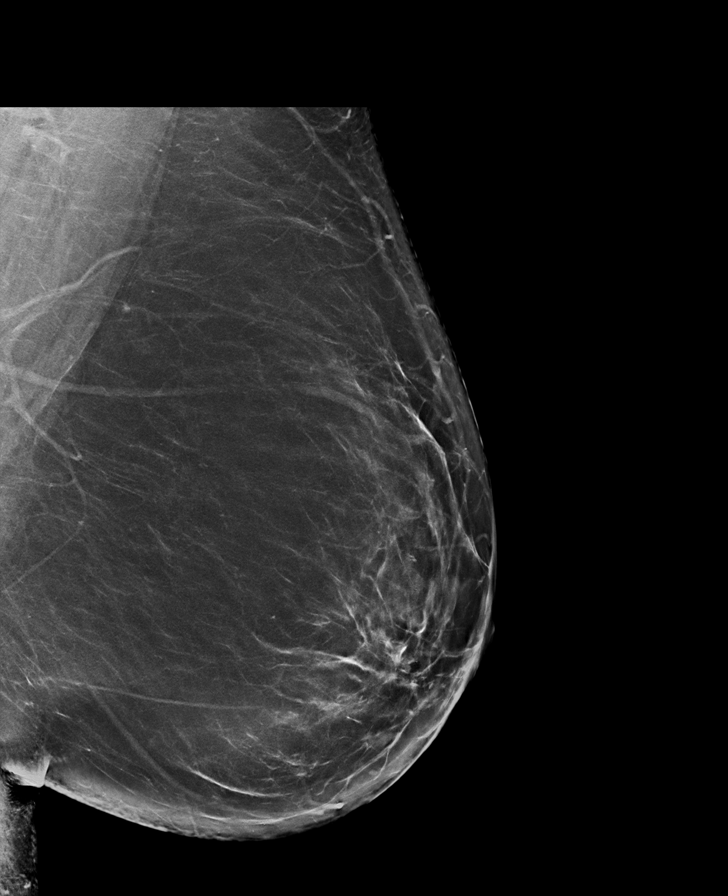

[R MLO tomo · tomo slice 48/95.0]
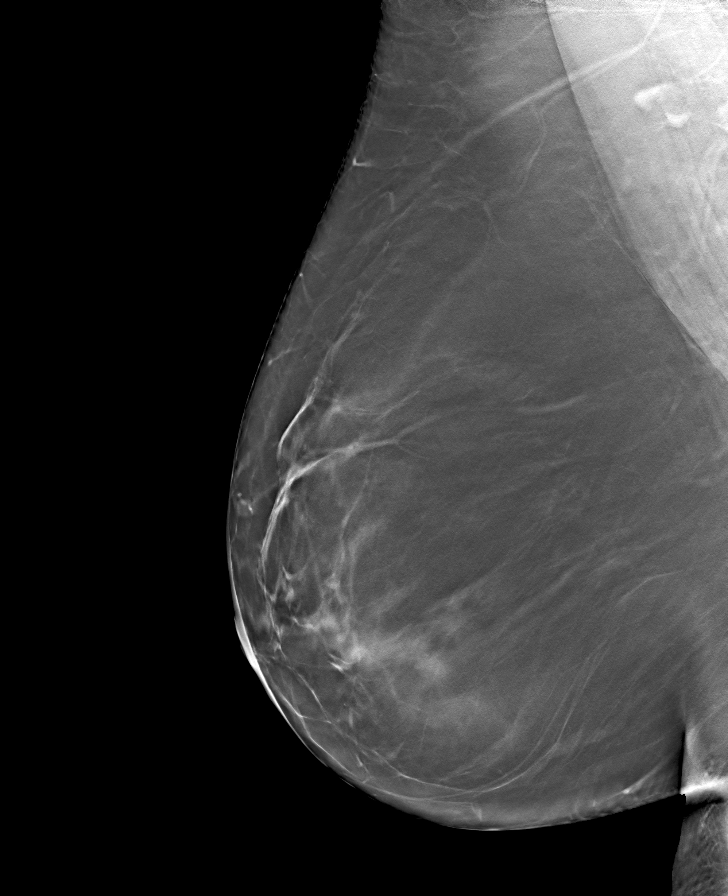

[L MLO tomo · tomo slice 47/93.0]
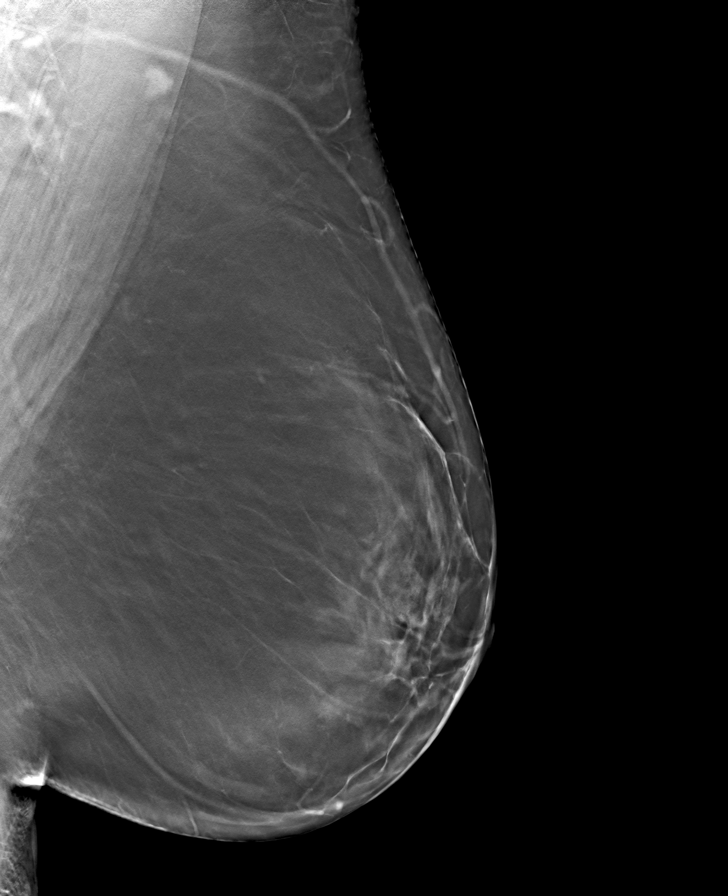

[R CC tomo · tomo slice 45/89.0]
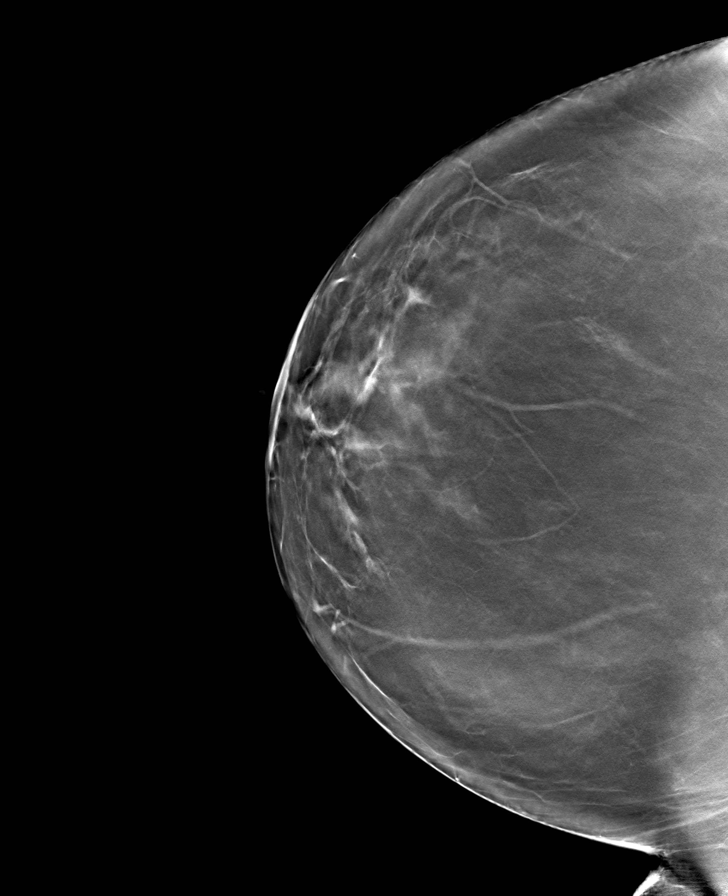

[L CC tomo · tomo slice 44/87.0]
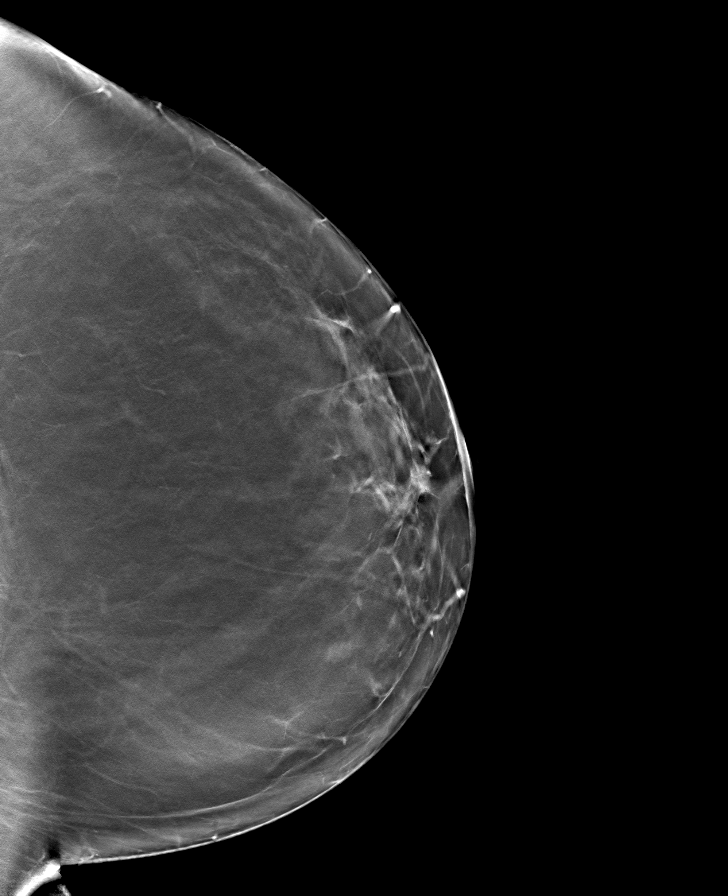

[8 of 24 positions shown; findings below may reference images not displayed]

FINDINGS: There are no findings suspicious for malignancy. Images were
processed with CAD.
IMPRESSION: No mammographic evidence of malignancy. A result letter of this
screening mammogram will be mailed directly to the patient.

RECOMMENDATION:
Screening mammogram in one year. (Code:[id number]-[id number])

BI-RADS CATEGORY  1: Negative.

## 2020-07-14 DIAGNOSIS — L821 Other seborrheic keratosis: Secondary | ICD-10-CM | POA: Diagnosis not present

## 2020-07-14 DIAGNOSIS — Z86018 Personal history of other benign neoplasm: Secondary | ICD-10-CM | POA: Diagnosis not present

## 2020-07-14 DIAGNOSIS — L304 Erythema intertrigo: Secondary | ICD-10-CM | POA: Diagnosis not present

## 2020-07-14 DIAGNOSIS — L738 Other specified follicular disorders: Secondary | ICD-10-CM | POA: Diagnosis not present

## 2020-07-14 DIAGNOSIS — D485 Neoplasm of uncertain behavior of skin: Secondary | ICD-10-CM | POA: Diagnosis not present

## 2020-08-17 DIAGNOSIS — C44319 Basal cell carcinoma of skin of other parts of face: Secondary | ICD-10-CM | POA: Diagnosis not present

## 2020-08-23 ENCOUNTER — Other Ambulatory Visit (HOSPITAL_COMMUNITY): Payer: Self-pay | Admitting: Obstetrics and Gynecology

## 2020-08-23 DIAGNOSIS — Z1231 Encounter for screening mammogram for malignant neoplasm of breast: Secondary | ICD-10-CM

## 2020-09-28 ENCOUNTER — Ambulatory Visit (HOSPITAL_COMMUNITY)
Admission: RE | Admit: 2020-09-28 | Discharge: 2020-09-28 | Disposition: A | Discharge status: Home / Self Care | Payer: 59 | Source: Ambulatory Visit | Attending: Obstetrics and Gynecology | Admitting: Obstetrics and Gynecology

## 2020-09-28 ENCOUNTER — Other Ambulatory Visit: Payer: Self-pay

## 2020-09-28 DIAGNOSIS — Z1231 Encounter for screening mammogram for malignant neoplasm of breast: Secondary | ICD-10-CM | POA: Insufficient documentation

## 2020-10-31 DIAGNOSIS — C44319 Basal cell carcinoma of skin of other parts of face: Secondary | ICD-10-CM | POA: Diagnosis not present

## 2020-12-26 ENCOUNTER — Telehealth (INDEPENDENT_AMBULATORY_CARE_PROVIDER_SITE_OTHER): Payer: Self-pay | Admitting: Internal Medicine

## 2020-12-26 NOTE — Telephone Encounter (Signed)
noted 

## 2020-12-26 NOTE — Telephone Encounter (Signed)
Talked with patient last week. She had colonoscopy in March 2014 with removal of small tubular adenoma. She would like to proceed with colonoscopy now. Will arrange for colonoscopy in June 2022.

## 2021-01-01 ENCOUNTER — Other Ambulatory Visit (HOSPITAL_COMMUNITY): Payer: Self-pay

## 2021-01-01 DIAGNOSIS — S76111A Strain of right quadriceps muscle, fascia and tendon, initial encounter: Secondary | ICD-10-CM | POA: Diagnosis not present

## 2021-01-01 DIAGNOSIS — M238X1 Other internal derangements of right knee: Secondary | ICD-10-CM | POA: Diagnosis not present

## 2021-01-01 MED ORDER — MELOXICAM 7.5 MG PO TABS
7.5000 mg | ORAL_TABLET | Freq: Every day | ORAL | 0 refills | Status: DC
  Filled 2021-01-01: qty 14, 14d supply, fill #0

## 2021-01-05 DIAGNOSIS — D3705 Neoplasm of uncertain behavior of pharynx: Secondary | ICD-10-CM | POA: Diagnosis not present

## 2021-01-24 ENCOUNTER — Encounter (INDEPENDENT_AMBULATORY_CARE_PROVIDER_SITE_OTHER): Payer: Self-pay

## 2021-01-24 ENCOUNTER — Telehealth (INDEPENDENT_AMBULATORY_CARE_PROVIDER_SITE_OTHER): Payer: Self-pay

## 2021-01-24 ENCOUNTER — Other Ambulatory Visit (HOSPITAL_COMMUNITY): Payer: Self-pay

## 2021-01-24 ENCOUNTER — Other Ambulatory Visit (INDEPENDENT_AMBULATORY_CARE_PROVIDER_SITE_OTHER): Payer: Self-pay

## 2021-01-24 DIAGNOSIS — D126 Benign neoplasm of colon, unspecified: Secondary | ICD-10-CM

## 2021-01-24 MED ORDER — PEG 3350-KCL-NA BICARB-NACL 420 G PO SOLR
4000.0000 mL | ORAL | 0 refills | Status: DC
  Filled 2021-01-24 – 2021-02-01 (×2): qty 4000, 1d supply, fill #0

## 2021-01-24 NOTE — Telephone Encounter (Signed)
[name], CMA  

## 2021-02-01 ENCOUNTER — Other Ambulatory Visit (HOSPITAL_COMMUNITY): Payer: Self-pay

## 2021-02-16 DIAGNOSIS — Z1329 Encounter for screening for other suspected endocrine disorder: Secondary | ICD-10-CM | POA: Diagnosis not present

## 2021-02-16 DIAGNOSIS — Z130 Encounter for screening for diseases of the blood and blood-forming organs and certain disorders involving the immune mechanism: Secondary | ICD-10-CM | POA: Diagnosis not present

## 2021-02-16 DIAGNOSIS — Z131 Encounter for screening for diabetes mellitus: Secondary | ICD-10-CM | POA: Diagnosis not present

## 2021-02-16 DIAGNOSIS — Z1321 Encounter for screening for nutritional disorder: Secondary | ICD-10-CM | POA: Diagnosis not present

## 2021-02-16 DIAGNOSIS — Z13228 Encounter for screening for other metabolic disorders: Secondary | ICD-10-CM | POA: Diagnosis not present

## 2021-03-01 ENCOUNTER — Encounter (HOSPITAL_COMMUNITY): Payer: Self-pay | Admitting: Internal Medicine

## 2021-03-01 ENCOUNTER — Other Ambulatory Visit: Payer: Self-pay

## 2021-03-01 ENCOUNTER — Ambulatory Visit (HOSPITAL_COMMUNITY)
Admission: RE | Admit: 2021-03-01 | Discharge: 2021-03-01 | Disposition: A | Discharge status: Home / Self Care | Payer: 59 | Attending: Internal Medicine | Admitting: Internal Medicine

## 2021-03-01 ENCOUNTER — Encounter (HOSPITAL_COMMUNITY)
Admission: RE | Disposition: A | Discharge status: Home / Self Care | Payer: Self-pay | Source: Home / Self Care | Attending: Internal Medicine

## 2021-03-01 DIAGNOSIS — K5730 Diverticulosis of large intestine without perforation or abscess without bleeding: Secondary | ICD-10-CM | POA: Insufficient documentation

## 2021-03-01 DIAGNOSIS — Z800 Family history of malignant neoplasm of digestive organs: Secondary | ICD-10-CM | POA: Insufficient documentation

## 2021-03-01 DIAGNOSIS — Z87891 Personal history of nicotine dependence: Secondary | ICD-10-CM | POA: Diagnosis not present

## 2021-03-01 DIAGNOSIS — Z1211 Encounter for screening for malignant neoplasm of colon: Secondary | ICD-10-CM | POA: Insufficient documentation

## 2021-03-01 DIAGNOSIS — D126 Benign neoplasm of colon, unspecified: Secondary | ICD-10-CM

## 2021-03-01 DIAGNOSIS — Z86010 Personal history of colonic polyps: Secondary | ICD-10-CM | POA: Insufficient documentation

## 2021-03-01 HISTORY — PX: COLONOSCOPY: SHX5424

## 2021-03-01 LAB — HM COLONOSCOPY

## 2021-03-01 SURGERY — COLONOSCOPY
Anesthesia: Moderate Sedation

## 2021-03-01 MED ORDER — MEPERIDINE HCL 50 MG/ML IJ SOLN
INTRAMUSCULAR | Status: DC | PRN
  Administered 2021-03-01 (×2): 25 mg via INTRAVENOUS

## 2021-03-01 MED ORDER — SODIUM CHLORIDE 0.9 % IV SOLN: INTRAVENOUS | Status: DC

## 2021-03-01 MED ORDER — MIDAZOLAM HCL 5 MG/5ML IJ SOLN
INTRAMUSCULAR | Status: AC
  Filled 2021-03-01: qty 10

## 2021-03-01 MED ORDER — MEPERIDINE HCL 50 MG/ML IJ SOLN
INTRAMUSCULAR | Status: AC
  Filled 2021-03-01: qty 1

## 2021-03-01 MED ORDER — MIDAZOLAM HCL 5 MG/5ML IJ SOLN
INTRAMUSCULAR | Status: DC | PRN
  Administered 2021-03-01 (×2): 2 mg via INTRAVENOUS
  Administered 2021-03-01: 3 mg via INTRAVENOUS

## 2021-03-01 NOTE — Op Note (Signed)
[facility] Patient Name: [name] Procedure Date: 03/01/2021 11:27 AM MRN: [id number] Date of Birth: 1971/01/02 Attending MD: Hildred Laser , MD CSN: [id number] Age: 50 Admit Type: Outpatient Procedure:                Colonoscopy Indications:              High risk colon cancer surveillance: Personal                            history of colonic polyps Providers:                Hildred Laser, MD, Charlsie Quest. Theda Sers RN, RN,                            Raphael Gibney, Technician Referring MD:             Asencion Noble, MD Medicines:                Meperidine 50 mg IV, Midazolam 7 mg IV Complications:            No immediate complications. Estimated Blood Loss:     Estimated blood loss: none. Procedure:                Pre-Anesthesia Assessment:                           - Prior to the procedure, a History and Physical                            was performed, and patient medications and                            allergies were reviewed. The patient's tolerance of                            previous anesthesia was also reviewed. The risks                            and benefits of the procedure and the sedation                            options and risks were discussed with the patient.                            All questions were answered, and informed consent                            was obtained. Prior Anticoagulants: The patient has                            taken no previous anticoagulant or antiplatelet                            agents except for NSAID medication. ASA Grade  Assessment: I - A normal, healthy patient. After                            reviewing the risks and benefits, the patient was                            deemed in satisfactory condition to undergo the                            procedure.                           After obtaining informed consent, the colonoscope                            was passed under direct vision.  Throughout the                            procedure, the patient's blood pressure, pulse, and                            oxygen saturations were monitored continuously. The                            PCF-HQ190L ([id number]) scope was introduced through                            the anus and advanced to the the cecum, identified                            by appendiceal orifice and ileocecal valve. The                            colonoscopy was performed without difficulty. The                            patient tolerated the procedure well. The quality                            of the bowel preparation was good. The ileocecal                            valve, appendiceal orifice, and rectum were                            photographed. Scope In: 11:54:13 AM Scope Out: 12:12:50 PM Scope Withdrawal Time: 0 hours 6 minutes 53 seconds  Total Procedure Duration: 0 hours 18 minutes 37 seconds  Findings:      The perianal and digital rectal examinations were normal.      Scattered diverticula were found in the sigmoid colon.      The exam was otherwise normal throughout the examined colon.      The retroflexed view of the distal rectum and anal verge was normal and       showed no  anal or rectal abnormalities. Impression:               - Diverticulosis in the sigmoid colon.                           - No specimens collected. Moderate Sedation:      Moderate (conscious) sedation was administered by the endoscopy nurse       and supervised by the endoscopist. The following parameters were       monitored: oxygen saturation, heart rate, blood pressure, CO2       capnography and response to care. Total physician intraservice time was       25 minutes. Recommendation:           - Patient has a contact number available for                            emergencies. The signs and symptoms of potential                            delayed complications were discussed with the                             patient. Return to normal activities tomorrow.                            Written discharge instructions were provided to the                            patient.                           - High fiber diet today.                           - Continue present medications.                           - Use sugar-free Metamucil one tablespoon PO daily.                           - Repeat colonoscopy in 7 years for surveillance. Procedure Code(s):        --- Professional ---                           [phone], Colonoscopy, flexible; diagnostic, including                            collection of specimen(s) by brushing or washing,                            when performed (separate procedure)                           99153, Moderate sedation; each additional 15  minutes intraservice time                           G0500, Moderate sedation services provided by the                            same physician or other qualified health care                            professional performing a gastrointestinal                            endoscopic service that sedation supports,                            requiring the presence of an independent trained                            observer to assist in the monitoring of the                            patient's level of consciousness and physiological                            status; initial 15 minutes of intra-service time;                            patient age 40 years or older (additional time may                            be reported with [phone], as appropriate) Diagnosis Code(s):        --- Professional ---                           Z86.010, Personal history of colonic polyps                           K57.30, Diverticulosis of large intestine without                            perforation or abscess without bleeding CPT copyright 2019 American [employer] Association. All rights reserved. The codes documented in this report are  preliminary and upon coder review may  be revised to meet current compliance requirements. Hildred Laser, MD Hildred Laser, MD 03/01/2021 12:21:16 PM This report has been signed electronically. Number of Addenda: 0

## 2021-03-01 NOTE — H&P (Signed)
[name] is an 50 y.o. female.   Chief Complaint: Patient is here for colonoscopy. HPI: Patient is 50 year old Caucasian female who has a history of colonic adenoma and is here for surveillance colonoscopy.  Last exam was in March 2004.  She was advised to come back in 7 years.  Procedure got delayed because of COVID.  She has no GI complaints.  She denies abdominal pain or rectal bleeding.  She has history of IBS and sometimes she has diarrhea and/or constipation. She does not take aspirin.  She takes ibuprofen on as-needed basis. Family history significant for colon carcinoma in paternal grandfather was 6 at the time of diagnosis and died of metastatic disease.  Her father has had colonic adenomas.  Past Medical History:  Diagnosis Date   GERD (gastroesophageal reflux disease)    Irritable bowel syndrome    Restless leg syndrome     Past Surgical History:  Procedure Laterality Date   CESAREAN SECTION  2004   COLONOSCOPY N/A 11/27/2012   Procedure: COLONOSCOPY;  Surgeon: Rogene Houston, MD;  [facility];  Service: Endoscopy;  Laterality: N/A;  [place]  2009   UPPER GASTROINTESTINAL ENDOSCOPY  2006    Family History  Problem Relation Age of Onset   Healthy Mother    Hypertension Father    Healthy Sister    Healthy Brother    Healthy Daughter    Healthy Son    Colon cancer Other    Social History:  reports that she quit smoking about 14 years ago. Her smoking use included cigarettes. She started smoking about 36 years ago. She has never used smokeless tobacco. She reports current alcohol use. She reports that she does not use drugs.  Allergies: No Known Allergies  Medications Prior to Admission  Medication Sig Dispense Refill   ibuprofen (ADVIL,MOTRIN) 800 MG tablet Take 800 mg by mouth every 8 (eight) hours as needed for pain.     meloxicam (MOBIC) 7.5 MG tablet Take 1 tablet by mouth daily (Patient not taking: No sig reported) 14 tablet 0    polyethylene glycol-electrolytes (TRILYTE) 420 g solution Take 4,000 mLs by mouth as directed. 4000 mL 0    No results found for this or any previous visit (from the past 48 hour(s)). No results found.  Review of Systems  Blood pressure 134/90, pulse 68, temperature 98.4 F (36.9 C), temperature source Oral, resp. rate 17, height 5\' 10"  (1.778 m), weight 104.3 kg, SpO2 96 %. Physical Exam HENT:     Mouth/Throat:     Mouth: Mucous membranes are moist.     Pharynx: Oropharynx is clear.  Eyes:     General: No scleral icterus.    Conjunctiva/sclera: Conjunctivae normal.  Cardiovascular:     Rate and Rhythm: Normal rate and regular rhythm.     Heart sounds: Normal heart sounds. No murmur heard. Pulmonary:     Effort: Pulmonary effort is normal.     Breath sounds: Normal breath sounds.  Abdominal:     General: There is no distension.     Palpations: Abdomen is soft. There is no mass.     Tenderness: There is no abdominal tenderness.  Musculoskeletal:        General: No swelling.     Cervical back: Neck supple.  Lymphadenopathy:     Cervical: No cervical adenopathy.  Skin:    General: Skin is warm and dry.  Neurological:     Mental Status:  She is alert.     Assessment/Plan  History of colonic adenoma. Surveillance colonoscopy.  Hildred Laser, MD 03/01/2021, 11:39 AM

## 2021-03-01 NOTE — Discharge Instructions (Signed)
Resume usual medications Metamucil or Citrucel 4 g by mouth daily at bedtime. High-fiber diet. No driving for 24 hours. Next colonoscopy in 7 years.

## 2021-03-02 ENCOUNTER — Encounter (INDEPENDENT_AMBULATORY_CARE_PROVIDER_SITE_OTHER): Payer: Self-pay | Admitting: *Deleted

## 2021-03-07 ENCOUNTER — Encounter (HOSPITAL_COMMUNITY): Payer: Self-pay | Admitting: Internal Medicine

## 2021-03-08 DIAGNOSIS — Z6835 Body mass index (BMI) 35.0-35.9, adult: Secondary | ICD-10-CM | POA: Diagnosis not present

## 2021-03-08 DIAGNOSIS — Z01419 Encounter for gynecological examination (general) (routine) without abnormal findings: Secondary | ICD-10-CM | POA: Diagnosis not present

## 2021-05-08 ENCOUNTER — Telehealth (INDEPENDENT_AMBULATORY_CARE_PROVIDER_SITE_OTHER): Payer: Self-pay | Admitting: *Deleted

## 2021-05-08 NOTE — Telephone Encounter (Signed)
Pt called and wanted to know if something could be called in for reflux. States she use to take zantac til it was taken off of market and now almost all foods even lettuce cause burning in throat. Has tried tums but it doesn't help for long.   Last OV was 12/12/17 with Deberah Castle for [place] and pt had colonoscopy  03/01/21  [facility].

## 2021-05-09 ENCOUNTER — Other Ambulatory Visit (INDEPENDENT_AMBULATORY_CARE_PROVIDER_SITE_OTHER): Payer: Self-pay | Admitting: *Deleted

## 2021-05-09 ENCOUNTER — Other Ambulatory Visit (HOSPITAL_COMMUNITY): Payer: Self-pay

## 2021-05-09 MED ORDER — PANTOPRAZOLE SODIUM 40 MG PO TBEC
40.0000 mg | DELAYED_RELEASE_TABLET | Freq: Every day | ORAL | 0 refills | Status: DC
  Filled 2021-05-09: qty 30, 30d supply, fill #0

## 2021-05-09 NOTE — Telephone Encounter (Signed)
Per Dr. Laural Golden - can try pantoprazole '40mg'$  #30 take one 30 minutes before breaksfast. Call back with update and then may send in refills if working well for pt. Needs OV within 6 months.  Discussed all with pt and she verbalized understanding.

## 2021-05-21 NOTE — Telephone Encounter (Signed)
Left message to return call to get update.

## 2021-05-22 NOTE — Telephone Encounter (Signed)
Pt states she still is getting some indigestion. Taking pantoprazole once daily. Has seen some improvement. States if she watches what she eats it is fine. Tried zantac in the past. Wanted to know if there was something else that is fast acting.

## 2021-05-23 NOTE — Telephone Encounter (Signed)
Per dr Laural Golden - Can take otc pepcid '20mg'$  qhs as needed along with protonix qam and if that does not relieve symptoms pt should change diet.  Left message to return call to discuss with pt.

## 2021-05-24 NOTE — Telephone Encounter (Signed)
Left message to return call 

## 2021-05-25 NOTE — Telephone Encounter (Signed)
Discussed with pt. Pt verbalized understanding.  °

## 2021-07-12 ENCOUNTER — Other Ambulatory Visit (HOSPITAL_COMMUNITY): Payer: Self-pay

## 2021-07-12 ENCOUNTER — Telehealth (INDEPENDENT_AMBULATORY_CARE_PROVIDER_SITE_OTHER): Payer: Self-pay | Admitting: *Deleted

## 2021-07-12 ENCOUNTER — Other Ambulatory Visit (INDEPENDENT_AMBULATORY_CARE_PROVIDER_SITE_OTHER): Payer: Self-pay | Admitting: *Deleted

## 2021-07-12 MED ORDER — PANTOPRAZOLE SODIUM 40 MG PO TBEC
40.0000 mg | DELAYED_RELEASE_TABLET | Freq: Every day | ORAL | 3 refills | Status: DC
  Filled 2021-07-12: qty 90, 90d supply, fill #0

## 2021-07-12 NOTE — Telephone Encounter (Signed)
Pt requesting a refill on protonix 40mg  one qam. States she was given a month supply to see if it worked and states it is working well and would like a 90 day supply.  [facility] pt [facility]. Colonoscopy 03/01/21.  Pt's number [phone]

## 2021-07-13 NOTE — Telephone Encounter (Signed)
Per dr Laural Golden give #90 with 3 refills and needs office visit in one year. Refills sent to pharm yesterday on 07/12/21. Need to call pt to make her aware refills sent and ov in one year.

## 2021-07-13 NOTE — Telephone Encounter (Signed)
Pt notified on voicemail  °

## 2021-08-28 ENCOUNTER — Other Ambulatory Visit (HOSPITAL_COMMUNITY): Payer: Self-pay | Admitting: Obstetrics and Gynecology

## 2021-08-28 DIAGNOSIS — Z1231 Encounter for screening mammogram for malignant neoplasm of breast: Secondary | ICD-10-CM

## 2021-10-05 ENCOUNTER — Other Ambulatory Visit: Payer: Self-pay

## 2021-10-05 ENCOUNTER — Ambulatory Visit (HOSPITAL_COMMUNITY)
Admission: RE | Admit: 2021-10-05 | Discharge: 2021-10-05 | Disposition: A | Discharge status: Home / Self Care | Payer: No Typology Code available for payment source | Source: Ambulatory Visit | Attending: Obstetrics and Gynecology | Admitting: Obstetrics and Gynecology

## 2021-10-05 DIAGNOSIS — Z1231 Encounter for screening mammogram for malignant neoplasm of breast: Secondary | ICD-10-CM | POA: Diagnosis present

## 2021-10-18 ENCOUNTER — Encounter (INDEPENDENT_AMBULATORY_CARE_PROVIDER_SITE_OTHER): Payer: Self-pay | Admitting: Gastroenterology

## 2021-10-18 ENCOUNTER — Other Ambulatory Visit: Payer: Self-pay

## 2021-10-18 ENCOUNTER — Ambulatory Visit (INDEPENDENT_AMBULATORY_CARE_PROVIDER_SITE_OTHER): Payer: No Typology Code available for payment source | Admitting: Gastroenterology

## 2021-10-18 VITALS — BP 114/75 | HR 65 | Temp 98.4°F | Ht 70.0 in | Wt 237.8 lb

## 2021-10-18 DIAGNOSIS — R195 Other fecal abnormalities: Secondary | ICD-10-CM

## 2021-10-18 DIAGNOSIS — R1114 Bilious vomiting: Secondary | ICD-10-CM

## 2021-10-18 DIAGNOSIS — R1012 Left upper quadrant pain: Secondary | ICD-10-CM | POA: Diagnosis not present

## 2021-10-18 NOTE — Progress Notes (Signed)
Referring Provider: Asencion Noble, MD Primary Care Physician:  Asencion Noble, MD Primary GI Physician: [facility]  Chief Complaint  Patient presents with   change in bowel habits    Change in bowel habits, LLQ pain, 4 episodes of vomiting since thanksgiving, nausea, bloating, gas, heartburn. Stools are yellow and last time vomited it was yellow. Symptoms started around thanksgiving. Pt states about 10 years ago dr Laural Golden told her she had a cyst/nodule on tail of pancreas and he was going to do a follow up scan but states she never got one.    HPI:   [name] is a 51 y.o. female with past medical history of GERD, IBS, and RLS.  Patient presenting today for left abdominal pain.  Patient has history of Intermittent left upper quadrant pain for many years. Prior workup has been negative. Did not respond to anti-spasmodic therapy. She has irritable bowel syndrome but irregular bowel movements have no relationship to her pain.  Her history and exam unfortunately does not provide any clues as to the etiology. Given location of her pain CT of abdomen was done previously to r/o lesions on tail of pancreas with recommendations to proceed with EUS if CT was negative. CT in 2013 was negative for acute findings, US abdomen complete in 2019 was also unremarkable other than hepatic steatosis.   Patient states that for the past few months she has had some issues with nausea/vomiting and changes in her bowel habits. She reports that she will have 2-3 BMs per day, stools are longer and skinner and yellow in color, BMs are soft but formed, she started noticing this around thanksgiving. She states that she started having nausea and vomiting thanksgiving night. Since then she has had intermittent nausea, she is unsure if symptoms are related to what she eats. She does remember that one time she ate a grilled chicken salad and noticed more nausea afterwards. She has occasional vomiting with the nausea. She denies any blood  in emesis. She states that last episode of vomiting was yellow in nature and almost projectile in nature, this occurred on 09/30/21, she states that she woke up around midnight and had to vomit. Has occasional LLQ pain but this is usually prior to having a BM. She states occasional RUQ pain as well. She reports that she does feel more bloated and gassy recently. Denies blood in stools or black stools. Denies fevers, chills, recent antibiotics or travel outside the country. No weight loss, or changes in appetite.  She continues to have LUQ pain. She reports that pain is more sore in nature as if someone punched her. She states that she notices symptoms even more if she lays down, like something is pressing. She endorses some L thoracic back pain as well.   Last Colonoscopy:03/01/21 diverticulosis in sigmoid colon, no specimens collected Last Endoscopy:?  Recommendations:  Repeat colonoscopy 2029 r/t personal hx of colon polyps  Past Medical History:  Diagnosis Date   GERD (gastroesophageal reflux disease)    Irritable bowel syndrome    Restless leg syndrome     Past Surgical History:  Procedure Laterality Date   CESAREAN SECTION  2004   COLONOSCOPY N/A 11/27/2012   Procedure: COLONOSCOPY;  Surgeon: Rogene Houston, MD;  [facility];  Service: Endoscopy;  Laterality: N/A;  930   COLONOSCOPY N/A 03/01/2021   Procedure: COLONOSCOPY;  Surgeon: Rogene Houston, MD;  Location[facility];  Service: Gastroenterology;  Laterality: N/A;  1:00  RECTOCELE REPAIR  2009   UPPER GASTROINTESTINAL ENDOSCOPY  2006    Current Outpatient Medications  Medication Sig Dispense Refill   ibuprofen (ADVIL,MOTRIN) 800 MG tablet Take 800 mg by mouth every 8 (eight) hours as needed for pain.     pantoprazole (PROTONIX) 40 MG tablet Take 1 tablet (40 mg total) by mouth daily. 90 tablet 3   No current facility-administered medications for this visit.    Allergies as of 10/18/2021   (No Known  Allergies)    Family History  Problem Relation Age of Onset   Healthy Mother    Hypertension Father    Healthy Sister    Healthy Brother    Healthy Daughter    Healthy Son    Colon cancer Other     Social History   Socioeconomic History   Marital status: Married    Spouse name: Not on file   Number of children: Not on file   Years of education: Not on file   Highest education level: Not on file  Occupational History   Not on file  Tobacco Use   Smoking status: Former    Years: 0.50    Types: Cigarettes    Start date: 12/01/1984    Quit date: 07/28/2006    Years since quitting: 15.2   Smokeless tobacco: Never   Tobacco comments:    patient smoked 1 pack every 2 weeks  Vaping Use   Vaping Use: Never used  Substance and Sexual Activity   Alcohol use: Yes    Alcohol/week: 0.0 standard drinks    Comment: Social   Drug use: No   Sexual activity: Not on file  Other Topics Concern   Not on file  Social History Narrative   Not on file   Social Determinants of Health   Financial Resource Strain: Not on file  Food Insecurity: Not on file  Transportation Needs: Not on file  Physical Activity: Not on file  Stress: Not on file  Social Connections: Not on file   Review of systems General: negative for malaise, night sweats, fever, chills, weight los Neck: Negative for lumps, goiter, pain and significant neck swelling Resp: Negative for cough, wheezing, dyspnea at rest CV: Negative for chest pain, leg swelling, palpitations, orthopnea GI: denies melena, hematochezia,  diarrhea, constipation, dysphagia, odyonophagia, early satiety or unintentional weight loss. +nausea +vomiting (bilious) +yellow stools +LUQ pain MSK: Negative for joint pain or swelling, back pain, and muscle pain. Derm: Negative for itching or rash Psych: Denies depression, anxiety, memory loss, confusion. No homicidal or suicidal ideation.  Heme: Negative for prolonged bleeding, bruising easily, and  swollen nodes. Endocrine: Negative for cold or heat intolerance, polyuria, polydipsia and goiter. Neuro: negative for tremor, gait imbalance, syncope and seizures. The remainder of the review of systems is noncontributory.  Physical Exam: BP 114/75 (BP Location: Right Arm, Patient Position: Sitting, Cuff Size: Large)    Pulse 65    Temp 98.4 F (36.9 C) (Oral)    Ht 5\' 10"  (1.778 m)    Wt 237 lb 12.8 oz (107.9 kg)    BMI 34.12 kg/m  General:   Alert and oriented. No distress noted. Pleasant and cooperative.  Head:  Normocephalic and atraumatic. Eyes:  Conjuctiva clear without scleral icterus. Mouth:  Oral mucosa pink and moist. Good dentition. No lesions. Heart: Normal rate and rhythm, s1 and s2 heart sounds present.  Lungs: Clear lung sounds in all lobes. Respirations equal and unlabored. Abdomen:  +BS, soft,  and  non-distended. TTP of LUQ. No rebound or guarding. No HSM or masses noted. Derm: No palmar erythema or jaundice Msk:  Symmetrical without gross deformities. Normal posture. Extremities:  Without edema. Neurologic:  Alert and  oriented x4 Psych:  Alert and cooperative. Normal mood and affect.  Invalid input(s): 6 MONTHS   ASSESSMENT: [name] is a 51 y.o. female presenting today with LUQ pain, nausea, vomiting and bilious stools.  Patient has hx of chronic, intermittent LUQ pain with query of possible pancreatic tail issues/pancreatic insufficiency in the past, however, no recent imaging done since 2019. Patient reports around thanksgiving she began having N/V and change in her stools. Stools are yellow and skinny in nature and she has had episodes of bilious vomiting. She is not having diarrhea or constipation. Recent colonoscopy in June 2022 with only findings of diverticulosis. She denies any alarm symptoms. Cannot pinpoint if nausea is precipitated by anything.  She does not take NSAIDs. Pain not worse or better with eating. We will proceed with checking liver enzymes,  lipase and blood counts as well as CT with pancreatic protocol for further evaluation to rule out pancreatic etiology of her symptoms. She has no RUQ pain to indicate gallbladder etiology. Symptoms are not consistent with PUD or gastritis.    PLAN:  CBC, CMP, Lipase 2. CT with pancreatic protocol   Follow Up: TBD after labs/CT imaging  Goldie Tregoning L. Alver Sorrow, MSN, APRN, AGNP-C Adult-Gerontology Nurse Practitioner [facility]

## 2021-10-18 NOTE — Patient Instructions (Signed)
We will proceed with labs to look at liver function as well as pancreas and proceed with a CT for further evaluation of your pancreas.   Please let me know if you develop any new or worsening symptoms. I will be in touch regarding results of labs and imaging once these are back

## 2021-10-19 LAB — CBC
HCT: 39.2 % (ref 35.0–45.0)
Hemoglobin: 13.3 g/dL (ref 11.7–15.5)
MCH: 30 pg (ref 27.0–33.0)
MCHC: 33.9 g/dL (ref 32.0–36.0)
MCV: 88.3 fL (ref 80.0–100.0)
MPV: 9.9 fL (ref 7.5–12.5)
Platelets: 213 10*3/uL (ref 140–400)
RBC: 4.44 10*6/uL (ref 3.80–5.10)
RDW: 12.4 % (ref 11.0–15.0)
WBC: 4.9 10*3/uL (ref 3.8–10.8)

## 2021-10-19 LAB — COMPREHENSIVE METABOLIC PANEL
AG Ratio: 2.3 (calc) (ref 1.0–2.5)
ALT: 25 U/L (ref 6–29)
AST: 20 U/L (ref 10–35)
Albumin: 4.2 g/dL (ref 3.6–5.1)
Alkaline phosphatase (APISO): 59 U/L (ref 37–153)
BUN: 16 mg/dL (ref 7–25)
CO2: 26 mmol/L (ref 20–32)
Calcium: 8.9 mg/dL (ref 8.6–10.4)
Chloride: 108 mmol/L (ref 98–110)
Creat: 0.91 mg/dL (ref 0.50–1.03)
Globulin: 1.8 g/dL (calc) — ABNORMAL LOW (ref 1.9–3.7)
Glucose, Bld: 97 mg/dL (ref 65–99)
Potassium: 4.3 mmol/L (ref 3.5–5.3)
Sodium: 141 mmol/L (ref 135–146)
Total Bilirubin: 0.4 mg/dL (ref 0.2–1.2)
Total Protein: 6 g/dL — ABNORMAL LOW (ref 6.1–8.1)

## 2021-10-19 LAB — LIPASE: Lipase: 33 U/L (ref 7–60)

## 2021-10-31 ENCOUNTER — Telehealth (INDEPENDENT_AMBULATORY_CARE_PROVIDER_SITE_OTHER): Payer: Self-pay

## 2021-10-31 ENCOUNTER — Other Ambulatory Visit (INDEPENDENT_AMBULATORY_CARE_PROVIDER_SITE_OTHER): Payer: Self-pay

## 2021-10-31 DIAGNOSIS — R1031 Right lower quadrant pain: Secondary | ICD-10-CM

## 2021-10-31 NOTE — Telephone Encounter (Signed)
[name] is calling today stating that her abdominal pain has now moved its hurting more in the RLQ area and she is concerned about appendix and is asking should we change the CT order which is tomorrow please advise?

## 2021-11-01 ENCOUNTER — Other Ambulatory Visit: Payer: Self-pay

## 2021-11-01 ENCOUNTER — Encounter (HOSPITAL_BASED_OUTPATIENT_CLINIC_OR_DEPARTMENT_OTHER): Payer: Self-pay

## 2021-11-01 ENCOUNTER — Ambulatory Visit (HOSPITAL_BASED_OUTPATIENT_CLINIC_OR_DEPARTMENT_OTHER): Admission: RE | Admit: 2021-11-01 | Payer: No Typology Code available for payment source | Source: Ambulatory Visit

## 2021-11-01 ENCOUNTER — Ambulatory Visit (HOSPITAL_BASED_OUTPATIENT_CLINIC_OR_DEPARTMENT_OTHER)
Admission: RE | Admit: 2021-11-01 | Discharge: 2021-11-01 | Disposition: A | Discharge status: Home / Self Care | Payer: No Typology Code available for payment source | Source: Ambulatory Visit | Attending: Gastroenterology | Admitting: Gastroenterology

## 2021-11-01 DIAGNOSIS — R1031 Right lower quadrant pain: Secondary | ICD-10-CM | POA: Diagnosis present

## 2021-11-01 MED ORDER — IOHEXOL 300 MG/ML  SOLN
100.0000 mL | Freq: Once | INTRAMUSCULAR | Status: AC | PRN
  Administered 2021-11-01: 85 mL via INTRAVENOUS

## 2021-11-05 ENCOUNTER — Encounter (INDEPENDENT_AMBULATORY_CARE_PROVIDER_SITE_OTHER): Payer: Self-pay

## 2021-11-06 ENCOUNTER — Ambulatory Visit (HOSPITAL_COMMUNITY)
Admission: RE | Admit: 2021-11-06 | Discharge: 2021-11-06 | Disposition: A | Discharge status: Home / Self Care | Payer: No Typology Code available for payment source | Source: Ambulatory Visit | Attending: Internal Medicine | Admitting: Internal Medicine

## 2021-11-06 ENCOUNTER — Other Ambulatory Visit (HOSPITAL_COMMUNITY): Payer: Self-pay | Admitting: Internal Medicine

## 2021-11-06 ENCOUNTER — Other Ambulatory Visit: Payer: Self-pay | Admitting: Internal Medicine

## 2021-11-06 ENCOUNTER — Other Ambulatory Visit: Payer: Self-pay

## 2021-11-06 DIAGNOSIS — M79605 Pain in left leg: Secondary | ICD-10-CM

## 2021-11-07 ENCOUNTER — Ambulatory Visit (HOSPITAL_COMMUNITY): Payer: No Typology Code available for payment source

## 2021-11-08 ENCOUNTER — Encounter (INDEPENDENT_AMBULATORY_CARE_PROVIDER_SITE_OTHER): Payer: Self-pay

## 2021-11-08 ENCOUNTER — Other Ambulatory Visit (INDEPENDENT_AMBULATORY_CARE_PROVIDER_SITE_OTHER): Payer: Self-pay

## 2021-11-08 DIAGNOSIS — R1012 Left upper quadrant pain: Secondary | ICD-10-CM

## 2021-11-08 DIAGNOSIS — R1114 Bilious vomiting: Secondary | ICD-10-CM

## 2021-12-05 DIAGNOSIS — R109 Unspecified abdominal pain: Secondary | ICD-10-CM

## 2021-12-05 DIAGNOSIS — R112 Nausea with vomiting, unspecified: Secondary | ICD-10-CM

## 2021-12-05 DIAGNOSIS — K449 Diaphragmatic hernia without obstruction or gangrene: Secondary | ICD-10-CM

## 2021-12-05 DIAGNOSIS — K2289 Other specified disease of esophagus: Secondary | ICD-10-CM

## 2021-12-05 DIAGNOSIS — K222 Esophageal obstruction: Secondary | ICD-10-CM

## 2021-12-06 ENCOUNTER — Encounter (HOSPITAL_COMMUNITY)
Admission: RE | Disposition: A | Discharge status: Home / Self Care | Payer: Self-pay | Source: Home / Self Care | Attending: Internal Medicine

## 2021-12-06 ENCOUNTER — Ambulatory Visit (HOSPITAL_COMMUNITY): Payer: No Typology Code available for payment source | Admitting: Anesthesiology

## 2021-12-06 ENCOUNTER — Other Ambulatory Visit: Payer: Self-pay

## 2021-12-06 ENCOUNTER — Ambulatory Visit (HOSPITAL_BASED_OUTPATIENT_CLINIC_OR_DEPARTMENT_OTHER): Payer: No Typology Code available for payment source | Admitting: Anesthesiology

## 2021-12-06 ENCOUNTER — Other Ambulatory Visit (HOSPITAL_COMMUNITY): Payer: Self-pay

## 2021-12-06 ENCOUNTER — Ambulatory Visit (HOSPITAL_COMMUNITY)
Admission: RE | Admit: 2021-12-06 | Discharge: 2021-12-06 | Disposition: A | Discharge status: Home / Self Care | Payer: No Typology Code available for payment source | Attending: Internal Medicine | Admitting: Internal Medicine

## 2021-12-06 ENCOUNTER — Encounter (HOSPITAL_COMMUNITY): Payer: Self-pay | Admitting: Internal Medicine

## 2021-12-06 DIAGNOSIS — Z87891 Personal history of nicotine dependence: Secondary | ICD-10-CM | POA: Diagnosis not present

## 2021-12-06 DIAGNOSIS — R109 Unspecified abdominal pain: Secondary | ICD-10-CM | POA: Insufficient documentation

## 2021-12-06 DIAGNOSIS — K449 Diaphragmatic hernia without obstruction or gangrene: Secondary | ICD-10-CM

## 2021-12-06 DIAGNOSIS — K219 Gastro-esophageal reflux disease without esophagitis: Secondary | ICD-10-CM | POA: Diagnosis not present

## 2021-12-06 DIAGNOSIS — K222 Esophageal obstruction: Secondary | ICD-10-CM | POA: Insufficient documentation

## 2021-12-06 DIAGNOSIS — K2289 Other specified disease of esophagus: Secondary | ICD-10-CM | POA: Diagnosis not present

## 2021-12-06 DIAGNOSIS — K209 Esophagitis, unspecified without bleeding: Secondary | ICD-10-CM | POA: Diagnosis not present

## 2021-12-06 DIAGNOSIS — R112 Nausea with vomiting, unspecified: Secondary | ICD-10-CM | POA: Insufficient documentation

## 2021-12-06 DIAGNOSIS — R1012 Left upper quadrant pain: Secondary | ICD-10-CM

## 2021-12-06 DIAGNOSIS — R1114 Bilious vomiting: Secondary | ICD-10-CM

## 2021-12-06 HISTORY — PX: ESOPHAGOGASTRODUODENOSCOPY (EGD) WITH PROPOFOL: SHX5813

## 2021-12-06 HISTORY — PX: BIOPSY: SHX5522

## 2021-12-06 SURGERY — ESOPHAGOGASTRODUODENOSCOPY (EGD) WITH PROPOFOL
Anesthesia: General

## 2021-12-06 MED ORDER — DICYCLOMINE HCL 10 MG PO CAPS
10.0000 mg | ORAL_CAPSULE | Freq: Every day | ORAL | 1 refills | Status: AC
  Filled 2021-12-06: qty 90, 90d supply, fill #0
  Filled 2022-10-07: qty 90, 90d supply, fill #1

## 2021-12-06 MED ORDER — PANTOPRAZOLE SODIUM 40 MG PO TBEC
40.0000 mg | DELAYED_RELEASE_TABLET | Freq: Every day | ORAL | 1 refills | Status: DC
Start: 2021-12-06 — End: 2021-12-06

## 2021-12-06 MED ORDER — LACTATED RINGERS IV SOLN: INTRAVENOUS | Status: DC

## 2021-12-06 MED ORDER — PROPOFOL 10 MG/ML IV BOLUS
INTRAVENOUS | Status: DC | PRN
  Administered 2021-12-06: 40 mg via INTRAVENOUS
  Administered 2021-12-06: 20 mg via INTRAVENOUS
  Administered 2021-12-06: 10 mg via INTRAVENOUS
  Administered 2021-12-06: 100 mg via INTRAVENOUS
  Administered 2021-12-06: 70 mg via INTRAVENOUS
  Administered 2021-12-06: 20 mg via INTRAVENOUS

## 2021-12-06 MED ORDER — LIDOCAINE 2% (20 MG/ML) 5 ML SYRINGE
INTRAMUSCULAR | Status: DC | PRN
  Administered 2021-12-06: 50 mg via INTRAVENOUS

## 2021-12-06 MED ORDER — ESOMEPRAZOLE MAGNESIUM 40 MG PO CPDR
40.0000 mg | DELAYED_RELEASE_CAPSULE | Freq: Every day | ORAL | 1 refills | Status: AC
  Filled 2021-12-06: qty 90, 90d supply, fill #0
  Filled 2022-05-28: qty 90, 90d supply, fill #1

## 2021-12-06 NOTE — H&P (Signed)
[name] is an 51 y.o. female.   ?Chief Complaint: Patient is here for esophagogastroduodenoscopy ?HPI: Patient is 51 year old Caucasian female who has been experiencing intermittent nausea vomiting epigastric burning and cramping and left lower quadrant since November 2022.  At times she has had greenish-yellowish stool but no melena or rectal bleeding.  No history of fever weight loss.  No history of travel or antibiotic use prior to onset of the symptoms.  She had colonoscopy last year at noted to have sigmoid diverticula.  She states she did try a gluten-free diet for 2 weeks but it did not make any difference.  Her lab studies including CBC comprehensive chemistry panel and lipase are normal except low serum globulin.  Abdominal pelvic CT did not reveal any abnormality to account for her symptoms. ?She does not take OTC NSAIDs. ?She is undergoing diagnostic esophagogastroduodenoscopy. ? ?Past Medical History:  ?Diagnosis Date  ? GERD (gastroesophageal reflux disease)   ? Irritable bowel syndrome   ? Restless leg syndrome   ? ? ?Past Surgical History:  ?Procedure Laterality Date  ? CESAREAN SECTION  09/09/2002  ? COLONOSCOPY N/A 11/27/2012  ? Procedure: COLONOSCOPY;  Surgeon: Rogene Houston, MD;  Location: [facility];  Service: Endoscopy;  Laterality: N/A;  930  ? COLONOSCOPY N/A 03/01/2021  ? Denis Carreon; diverticulosos in sigmoid colon, no specimens, recommend 7 year repeat r/t previous colon polyps  ? RECTOCELE REPAIR  09/10/2007  ? UPPER GASTROINTESTINAL ENDOSCOPY  09/09/2004  ? ? ?Family History  ?Problem Relation Age of Onset  ? Healthy Mother   ? Hypertension Father   ? Healthy Sister   ? Healthy Brother   ? Healthy Daughter   ? Healthy Son   ? Colon cancer Other   ? ?Social History:  reports that she quit smoking about 15 years ago. Her smoking use included cigarettes. She started smoking about 37 years ago. She has never used smokeless tobacco. She reports current alcohol use. She reports that she  does not use drugs. ? ?Allergies: No Known Allergies ? ?Medications Prior to Admission  ?Medication Sig Dispense Refill  ? Multiple Vitamin (MULTIVITAMIN WITH MINERALS) TABS tablet Take 1 tablet by mouth daily.    ? polyethylene glycol (MIRALAX / GLYCOLAX) 17 g packet Take 17 g by mouth daily.    ? VITAMIN D PO Take 1 tablet by mouth daily.    ? pantoprazole (PROTONIX) 40 MG tablet Take 1 tablet (40 mg total) by mouth daily. (Patient not taking: Reported on 12/04/2021) 90 tablet 3  ? ? ?No results found for this or any previous visit (from the past 48 hour(s)). ?No results found. ? ?Review of Systems ? ?Blood pressure 127/75, temperature 97.7 ?F (36.5 ?C), temperature source Oral, resp. rate 17, height '5\' 10"'$  (1.778 m), weight 104.3 kg, SpO2 97 %. ?Physical Exam ?HENT:  ?   Mouth/Throat:  ?   Mouth: Mucous membranes are moist.  ?   Pharynx: Oropharynx is clear.  ?Eyes:  ?   General: No scleral icterus. ?   Conjunctiva/sclera: Conjunctivae normal.  ?Cardiovascular:  ?   Rate and Rhythm: Normal rate and regular rhythm.  ?   Heart sounds: Normal heart sounds. No murmur heard. ?Pulmonary:  ?   Effort: Pulmonary effort is normal.  ?   Breath sounds: Normal breath sounds.  ?Abdominal:  ?   General: There is no distension.  ?   Palpations: Abdomen is soft. There is no mass.  ?   Tenderness: There  is no abdominal tenderness.  ?Musculoskeletal:     ?   General: No swelling.  ?   Cervical back: Neck supple.  ?Lymphadenopathy:  ?   Cervical: No cervical adenopathy.  ?Skin: ?   General: Skin is warm and dry.  ?Neurological:  ?   Mental Status: She is alert.  ?  ? ?Assessment/Plan ? ?Nausea vomiting and abdominal pain. ?Diagnostic esophagogastroduodenoscopy. ? ?Hildred Laser, MD ?12/06/2021, 1:44 PM ? ? ? ?

## 2021-12-06 NOTE — Discharge Instructions (Addendum)
No aspirin or NSAIDs for 24 hours ? Esomeprazole 40 mg by mouth 30 minutes before breakfast daily. ?Dicyclomine 10 mg by mouth 30 minutes before breakfast daily. ?Resume other medications as before ?Resume usual diet ?No driving for 24 hours ?Please keep symptom diary as discussed ?Physician will call with biopsy results and further recommendations. ?

## 2021-12-06 NOTE — Anesthesia Postprocedure Evaluation (Signed)
Anesthesia Post Note ? ?Patient: SATORIA DUNLOP ? ?Procedure(s) Performed: ESOPHAGOGASTRODUODENOSCOPY (EGD) WITH PROPOFOL ?BIOPSY ? ?Patient location during evaluation: Phase II ?Anesthesia Type: General ?Level of consciousness: awake ?Pain management: pain level controlled ?Vital Signs Assessment: post-procedure vital signs reviewed and stable ?Respiratory status: spontaneous breathing and respiratory function stable ?Cardiovascular status: blood pressure returned to baseline and stable ?Postop Assessment: no headache and no apparent nausea or vomiting ?Anesthetic complications: no ?Comments: Late entry ? ? ?No notable events documented. ? ? ?Last Vitals:  ?Vitals:  ? 12/06/21 1217 12/06/21 1412  ?BP: 127/75 (!) 101/55  ?Pulse:  75  ?Resp: 17 16  ?Temp: 36.5 ?C (!) 36.4 ?C  ?SpO2: 97% 96%  ?  ?Last Pain:  ?Vitals:  ? 12/06/21 1412  ?TempSrc: Oral  ?PainSc: 0-No pain  ? ? ?  ?  ?  ?  ?  ?  ? ?Louann Sjogren ? ? ? ? ?

## 2021-12-06 NOTE — Transfer of Care (Signed)
Immediate Anesthesia Transfer of Care Note ? ?Patient: [name] ? ?Procedure(s) Performed: ESOPHAGOGASTRODUODENOSCOPY (EGD) WITH PROPOFOL ?BIOPSY ? ?Patient Location: Endoscopy Unit ? ?Anesthesia Type:MAC ? ?Level of Consciousness: awake, alert , oriented and patient cooperative ? ?Airway & Oxygen Therapy: Patient Spontanous Breathing ? ?Post-op Assessment: Report given to RN, Post -op Vital signs reviewed and stable and Patient moving all extremities ? ?Post vital signs: Reviewed and stable ? ?Last Vitals:  ?Vitals Value Taken Time  ?BP    ?Temp    ?Pulse    ?Resp    ?SpO2    ? ? ?Last Pain:  ?Vitals:  ? 12/06/21 1355  ?TempSrc:   ?PainSc: 0-No pain  ?   ? ?Patients Stated Pain Goal: 9 (12/06/21 1217) ? ?Complications: No notable events documented. ?

## 2021-12-06 NOTE — Op Note (Signed)
[facility] ?Patient Name: [name] ?Procedure Date: 12/06/2021 1:42 PM ?MRN: [id number] ?Date of Birth: 01-Sep-1971 ?Attending MD: Hildred Laser , MD ?CSN: [id number] ?Age: 51 ?Admit Type: Outpatient ?Procedure:                Upper GI endoscopy ?Indications:              Abdominal pain, Nausea with vomiting ?Providers:                Hildred Laser, MD, Lambert Mody, Crisann  ?                          Wynonia Lawman, Technician ?Referring MD:             Asencion Noble, MD ?Medicines:                Propofol per Anesthesia ?Complications:            No immediate complications. ?Estimated Blood Loss:     Estimated blood loss was minimal. ?Procedure:                Pre-Anesthesia Assessment: ?                          - Prior to the procedure, a History and Physical  ?                          was performed, and patient medications and  ?                          allergies were reviewed. The patient's tolerance of  ?                          previous anesthesia was also reviewed. The risks  ?                          and benefits of the procedure and the sedation  ?                          options and risks were discussed with the patient.  ?                          All questions were answered, and informed consent  ?                          was obtained. Prior Anticoagulants: The patient has  ?                          taken no previous anticoagulant or antiplatelet  ?                          agents. ASA Grade Assessment: II - A patient with  ?                          mild systemic disease. After reviewing the risks  ?  and benefits, the patient was deemed in  ?                          satisfactory condition to undergo the procedure. ?                          After obtaining informed consent, the endoscope was  ?                          passed under direct vision. Throughout the  ?                          procedure, the patient's blood pressure, pulse, and  ?                           oxygen saturations were monitored continuously. The  ?                          GIF-H190 ([id number]) scope was introduced through the  ?                          mouth, and advanced to the second part of duodenum.  ?                          The upper GI endoscopy was accomplished without  ?                          difficulty. The patient tolerated the procedure  ?                          well. ?Scope In: 1:57:35 PM ?Scope Out: 2:09:39 PM ?Total Procedure Duration: 0 hours 12 minutes 4 seconds  ?Findings: ?     The hypopharynx was normal. ?     The proximal esophagus, mid esophagus and distal esophagus were normal. ?     Islands of salmon-colored mucosa were present at 34 cm. The maximum  ?     longitudinal extent of these esophageal mucosal changes was 0.5 cm in  ?     length. Biopsies were taken with a cold forceps for histology. The  ?     pathology specimen was placed into Bottle Number 2. ?     A widely patent Schatzki ring was found at the gastroesophageal junction. ?     A 2 cm hiatal hernia was present. ?     The entire examined stomach was normal. ?     The duodenal bulb and second portion of the duodenum were normal.  ?     Biopsies for histology were taken with a cold forceps for evaluation of  ?     celiac disease. The pathology specimen was placed into Bottle Number 1. ?Impression:               - Normal hypopharynx. ?                          - Normal proximal esophagus, mid esophagus and  ?  distal esophagus. ?                          Lorriane Shire of Salmon-colored mucosa at  ?                          distal esophagus just proximal to GEJ. Biopsied. ?                          - Widely patent Schatzki ring. ?                          - 2 cm hiatal hernia. ?                          - Normal stomach. ?                          - Normal duodenal bulb and second portion of the  ?                          duodenum. Biopsied. ?Moderate Sedation: ?     Per Anesthesia  Care ?Recommendation:           - Patient has a contact number available for  ?                          emergencies. The signs and symptoms of potential  ?                          delayed complications were discussed with the  ?                          patient. Return to normal activities tomorrow.  ?                          Written discharge instructions were provided to the  ?                          patient. ?                          - Resume previous diet today. ?                          - Continue present medications. ?                          - Await pathology results. ?Procedure Code(s):        --- Professional --- ?                          [phone], Esophagogastroduodenoscopy, flexible,  ?                          transoral; with biopsy, single or multiple ?Diagnosis Code(s):        --- Professional --- ?  K22.8, Other specified diseases of esophagus ?                          K22.2, Esophageal obstruction ?                          K44.9, Diaphragmatic hernia without obstruction or  ?                          gangrene ?                          R10.9, Unspecified abdominal pain ?                          R11.2, Nausea with vomiting, unspecified ?CPT copyright 2019 [employer] Association. All rights reserved. ?The codes documented in this report are preliminary and upon coder review may  ?be revised to meet current compliance requirements. ?Hildred Laser, MD ?Hildred Laser, MD ?12/06/2021 2:22:36 PM ?This report has been signed electronically. ?Number of Addenda: 0 ?

## 2021-12-06 NOTE — Anesthesia Preprocedure Evaluation (Signed)
Anesthesia Evaluation  Patient identified by MRN, date of birth, ID band Patient awake    Reviewed: Allergy & Precautions, H&P , NPO status , Patient's Chart, lab work & pertinent test results, reviewed documented beta blocker date and time   Airway Mallampati: II  TM Distance: >3 FB Neck ROM: full    Dental no notable dental hx.    Pulmonary neg pulmonary ROS, former smoker,    Pulmonary exam normal breath sounds clear to auscultation       Cardiovascular Exercise Tolerance: Good negative cardio ROS   Rhythm:regular Rate:Normal     Neuro/Psych negative neurological ROS  negative psych ROS   GI/Hepatic Neg liver ROS, GERD  Medicated,  Endo/Other  negative endocrine ROS  Renal/GU negative Renal ROS  negative genitourinary   Musculoskeletal   Abdominal   Peds  Hematology negative hematology ROS (+)   Anesthesia Other Findings   Reproductive/Obstetrics negative OB ROS                             Anesthesia Physical Anesthesia Plan  ASA: 2  Anesthesia Plan: General   Post-op Pain Management:    Induction:   PONV Risk Score and Plan: Propofol infusion  Airway Management Planned:   Additional Equipment:   Intra-op Plan:   Post-operative Plan:   Informed Consent: I have reviewed the patients History and Physical, chart, labs and discussed the procedure including the risks, benefits and alternatives for the proposed anesthesia with the patient or authorized representative who has indicated his/her understanding and acceptance.     Dental Advisory Given  Plan Discussed with: CRNA  Anesthesia Plan Comments:         Anesthesia Quick Evaluation  

## 2021-12-10 LAB — SURGICAL PATHOLOGY

## 2021-12-10 NOTE — Addendum Note (Signed)
Addendum  created 12/10/21 1429 by Orlie Dakin, CRNA  ? Intraprocedure Staff edited  ?  ?

## 2021-12-11 ENCOUNTER — Encounter (HOSPITAL_COMMUNITY): Payer: Self-pay | Admitting: Internal Medicine

## 2022-02-14 ENCOUNTER — Other Ambulatory Visit (HOSPITAL_COMMUNITY): Payer: Self-pay

## 2022-02-14 MED ORDER — OZEMPIC (0.25 OR 0.5 MG/DOSE) 2 MG/3ML ~~LOC~~ SOPN
0.2500 mg | PEN_INJECTOR | SUBCUTANEOUS | 0 refills | Status: DC
  Filled 2022-02-14 – 2022-02-15 (×2): qty 3, 28d supply, fill #0
  Filled 2022-02-20: qty 3, 56d supply, fill #0

## 2022-02-15 ENCOUNTER — Other Ambulatory Visit (HOSPITAL_COMMUNITY): Payer: Self-pay

## 2022-02-20 ENCOUNTER — Other Ambulatory Visit (HOSPITAL_COMMUNITY): Payer: Self-pay

## 2022-03-01 ENCOUNTER — Other Ambulatory Visit (HOSPITAL_COMMUNITY): Payer: Self-pay

## 2022-03-04 ENCOUNTER — Other Ambulatory Visit (HOSPITAL_COMMUNITY): Payer: Self-pay

## 2022-03-04 MED ORDER — SAXENDA 18 MG/3ML ~~LOC~~ SOPN
3.0000 mg | PEN_INJECTOR | Freq: Every day | SUBCUTANEOUS | 0 refills | Status: DC
Start: 2022-03-04 — End: 2022-04-16
  Filled 2022-03-04 – 2022-03-05 (×2): qty 15, 30d supply, fill #0

## 2022-03-04 MED ORDER — INSULIN PEN NEEDLE 32G X 4 MM MISC
1.0000 | Freq: Every day | 0 refills | Status: DC
  Filled 2022-03-04: qty 100, 90d supply, fill #0

## 2022-03-05 ENCOUNTER — Other Ambulatory Visit (HOSPITAL_COMMUNITY): Payer: Self-pay

## 2022-03-20 ENCOUNTER — Ambulatory Visit (INDEPENDENT_AMBULATORY_CARE_PROVIDER_SITE_OTHER): Payer: No Typology Code available for payment source | Admitting: Dermatology

## 2022-03-20 ENCOUNTER — Encounter: Payer: Self-pay | Admitting: Dermatology

## 2022-03-20 DIAGNOSIS — Z1283 Encounter for screening for malignant neoplasm of skin: Secondary | ICD-10-CM

## 2022-03-20 DIAGNOSIS — L821 Other seborrheic keratosis: Secondary | ICD-10-CM

## 2022-03-20 DIAGNOSIS — D1801 Hemangioma of skin and subcutaneous tissue: Secondary | ICD-10-CM | POA: Diagnosis not present

## 2022-03-20 DIAGNOSIS — Z85828 Personal history of other malignant neoplasm of skin: Secondary | ICD-10-CM

## 2022-03-20 DIAGNOSIS — L738 Other specified follicular disorders: Secondary | ICD-10-CM

## 2022-04-13 ENCOUNTER — Encounter: Payer: Self-pay | Admitting: Dermatology

## 2022-04-13 NOTE — Progress Notes (Signed)
   Follow-Up Visit   Subjective  [name] is a 51 y.o. female who presents for the following: New Patient (Initial Visit) (Patient here today for skin check, no concerns. Personal history of non mole skin cancer. No family history of atypical moles, melanoma or non mole skin cancer. ).  Annual skin examination, history of facial skin cancer Location:  Duration:  Quality:  Associated Signs/Symptoms: Modifying Factors:  Severity:  Timing: Context:   Objective  Well appearing patient in no apparent distress; mood and affect are within normal limits. Full body skin exam: No atypical pigmented lesions (all checked with dermoscopy), no new or recurrent nonmelanoma skin cancer  Right Malar Cheek No sign recurrence  Left Forearm - Posterior, Mid Back 5 Millimeter textured flattopped light brown papules, compatible dermoscopy  Left Abdomen (side) - Upper Multiple smooth red 1 mm dermal papules  Head - Anterior (Face) Several 2 mm flesh-colored papules with eccentric dell    A full examination was performed including scalp, head, eyes, ears, nose, lips, neck, chest, axillae, abdomen, back, buttocks, bilateral upper extremities, bilateral lower extremities, hands, feet, fingers, toes, fingernails, and toenails. All findings within normal limits unless otherwise noted below.  Beneath undergarments not fully examined.   Assessment & Plan    Screening for malignant neoplasm of skin  Yearly skin exam.  To self examine twice annually.  Continue ultraviolet protection.  History of basal cell carcinoma (BCC) of skin Right Malar Cheek  Check as needed change  Seborrheic keratosis (2) Left Forearm - Posterior; Mid Back  Leave if stable  Hemangioma of skin Left Abdomen (side) - Upper  No intervention indicated  Sebaceous hyperplasia Head - Anterior (Face)  Told of similar appearance of early BCC so if any lesion grows or bleeds return for biopsy      I, Lavonna Monarch, MD, have reviewed all documentation for this visit.  The documentation on 04/13/22 for the exam, diagnosis, procedures, and orders are all accurate and complete.

## 2022-04-16 ENCOUNTER — Other Ambulatory Visit (HOSPITAL_COMMUNITY): Payer: Self-pay

## 2022-04-16 MED ORDER — INSULIN PEN NEEDLE 32G X 4 MM MISC
1.0000 | Freq: Every day | 0 refills | Status: AC
  Filled 2022-04-16: qty 100, 100d supply, fill #0

## 2022-04-16 MED ORDER — SAXENDA 18 MG/3ML ~~LOC~~ SOPN
3.0000 mg | PEN_INJECTOR | Freq: Every day | SUBCUTANEOUS | 0 refills | Status: DC
  Filled 2022-04-16: qty 15, 30d supply, fill #0

## 2022-04-17 ENCOUNTER — Other Ambulatory Visit (HOSPITAL_COMMUNITY): Payer: Self-pay

## 2022-05-27 ENCOUNTER — Other Ambulatory Visit (HOSPITAL_COMMUNITY): Payer: Self-pay

## 2022-05-27 MED ORDER — TECHLITE PEN NEEDLES 32G X 4 MM MISC
1.0000 | Freq: Every day | 0 refills | Status: AC
  Filled 2022-05-27: qty 100, 90d supply, fill #0

## 2022-05-27 MED ORDER — SAXENDA 18 MG/3ML ~~LOC~~ SOPN
3.0000 mg | PEN_INJECTOR | Freq: Every day | SUBCUTANEOUS | 3 refills | Status: AC
  Filled 2022-05-27: qty 15, 30d supply, fill #0

## 2022-05-28 ENCOUNTER — Other Ambulatory Visit (HOSPITAL_COMMUNITY): Payer: Self-pay

## 2022-06-13 ENCOUNTER — Other Ambulatory Visit (HOSPITAL_COMMUNITY): Payer: Self-pay

## 2022-06-13 MED ORDER — WEGOVY 1.7 MG/0.75ML ~~LOC~~ SOAJ
1.7000 mg | SUBCUTANEOUS | 0 refills | Status: DC
  Filled 2022-06-13 – 2022-06-20 (×3): qty 9, 84d supply, fill #0

## 2022-06-14 ENCOUNTER — Other Ambulatory Visit: Payer: Self-pay | Admitting: Family Medicine

## 2022-06-14 DIAGNOSIS — R0602 Shortness of breath: Secondary | ICD-10-CM

## 2022-06-20 ENCOUNTER — Other Ambulatory Visit (HOSPITAL_COMMUNITY): Payer: Self-pay

## 2022-06-27 ENCOUNTER — Other Ambulatory Visit (HOSPITAL_COMMUNITY): Payer: Self-pay

## 2022-06-27 MED ORDER — ONDANSETRON 4 MG PO TBDP
4.0000 mg | ORAL_TABLET | Freq: Four times a day (QID) | ORAL | 1 refills | Status: AC | PRN
  Filled 2022-06-27: qty 30, 8d supply, fill #0
  Filled 2022-10-07: qty 30, 8d supply, fill #1

## 2022-06-27 MED ORDER — PROMETHAZINE HCL 25 MG PO TABS
25.0000 mg | ORAL_TABLET | Freq: Four times a day (QID) | ORAL | 1 refills | Status: AC | PRN
  Filled 2022-06-27: qty 10, 3d supply, fill #0
  Filled 2022-10-07: qty 10, 3d supply, fill #1

## 2022-06-28 ENCOUNTER — Ambulatory Visit
Admission: RE | Admit: 2022-06-28 | Discharge: 2022-06-28 | Disposition: A | Discharge status: Home / Self Care | Payer: No Typology Code available for payment source | Source: Ambulatory Visit | Attending: Family Medicine | Admitting: Family Medicine

## 2022-06-28 DIAGNOSIS — R0602 Shortness of breath: Secondary | ICD-10-CM

## 2022-06-30 IMAGING — CT CT ABD-PELV W/ CM
2 of 5 series · 15 of 46 positions shown, 17 images · IV contrast (APPLIED)
Comparison: CT 08/26/2012

CLINICAL DATA: Right lower quadrant abdominal pain. Left upper
quadrant abdominal pain. Patient reports symptoms for 1 month.
Nausea.

EXAM:
CT ABDOMEN AND PELVIS WITH CONTRAST
TECHNIQUE: Multidetector CT imaging of the abdomen and pelvis was performed
using the standard protocol following bolus administration of
intravenous contrast.

[Series 2: abd pel w · axial · 0.90mm/px · z∈[+728,+1218]mm · 12 of 108 slices shown, 14 images]
[im 5/108  soft-tissue]
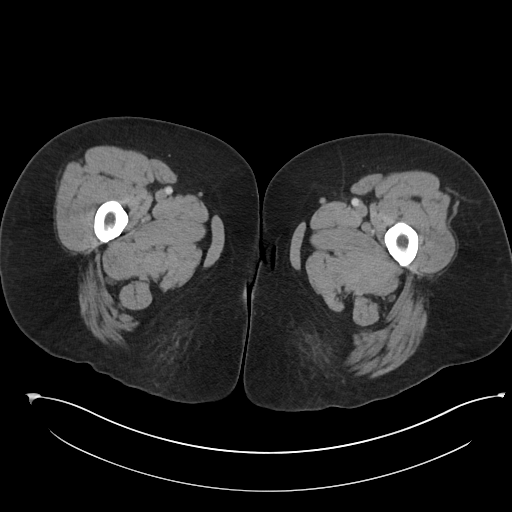
[im 5/108  bone]
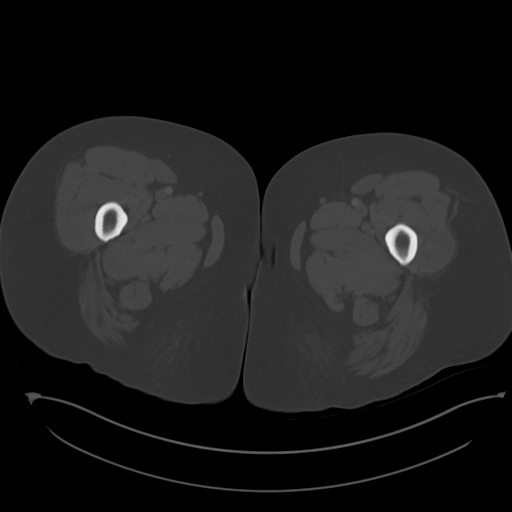
[im 15/108  soft-tissue]
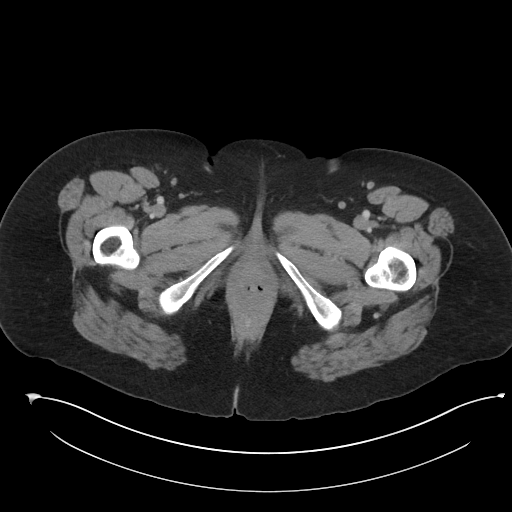
[im 25/108  soft-tissue]
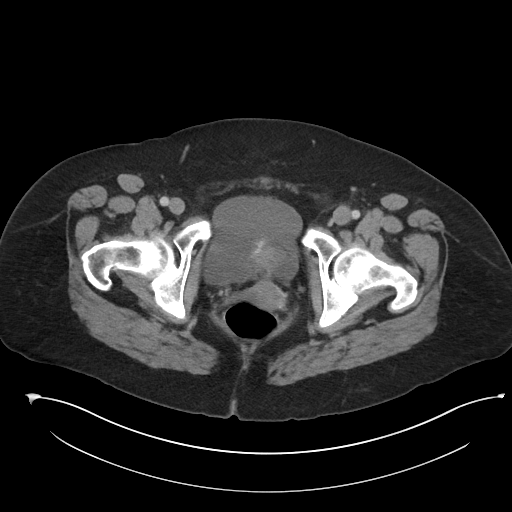
[im 35/108  soft-tissue]
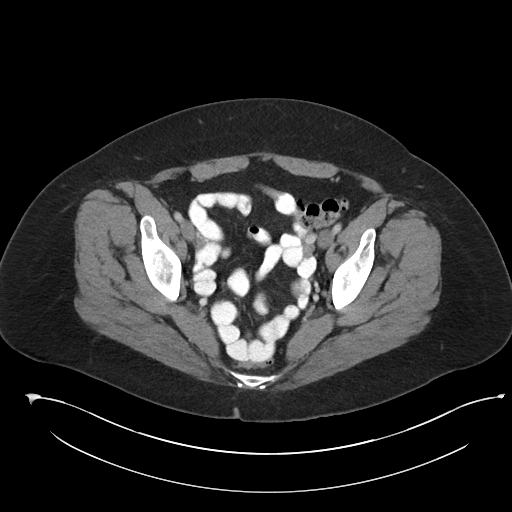
[im 39/108  soft-tissue]
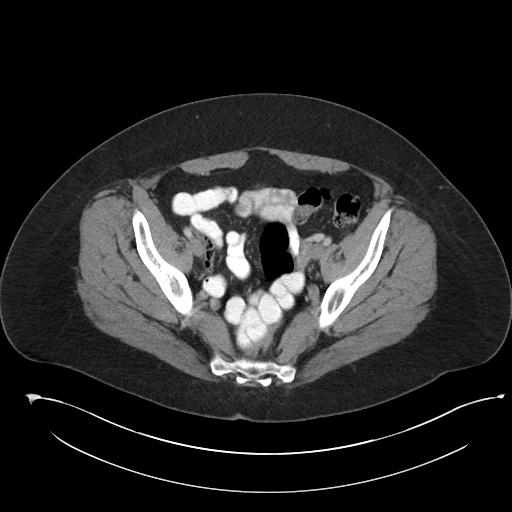
[im 49/108  soft-tissue]
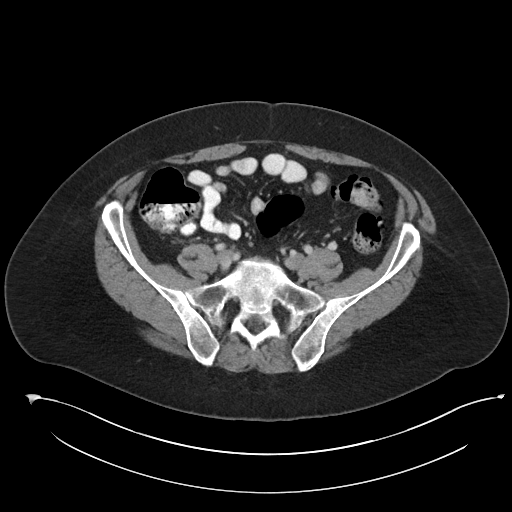
[im 59/108  soft-tissue]
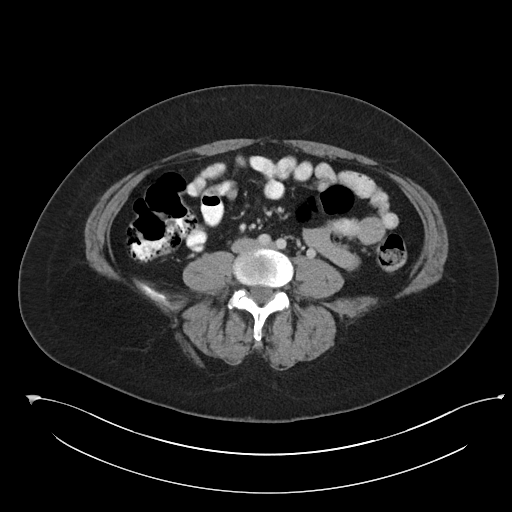
[im 69/108  soft-tissue]
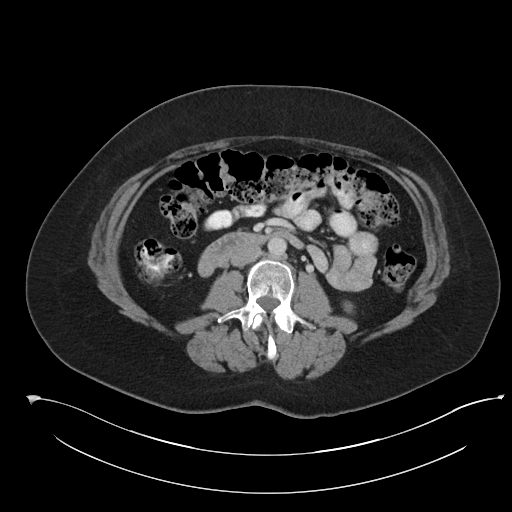
[im 73/108  soft-tissue]
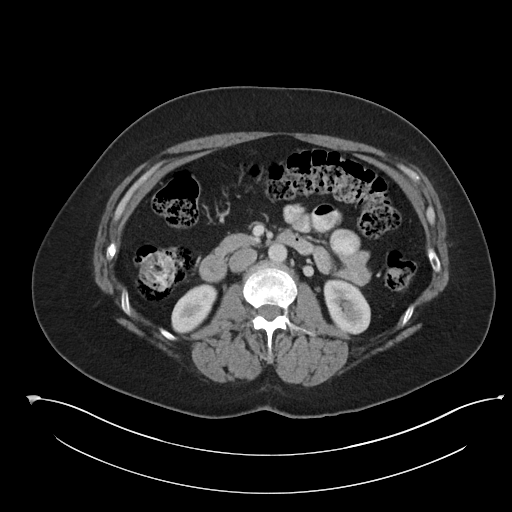
[im 73/108  bone]
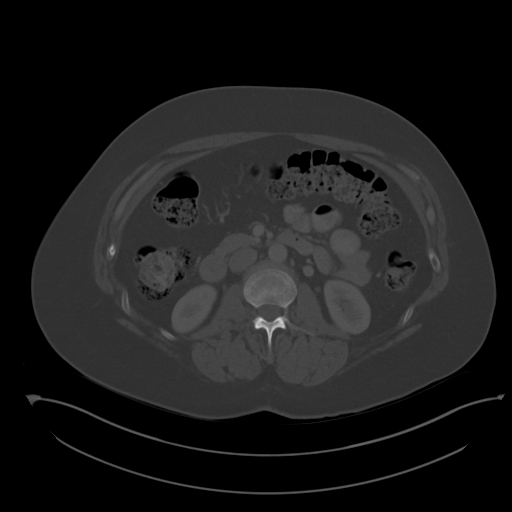
[im 83/108  soft-tissue]
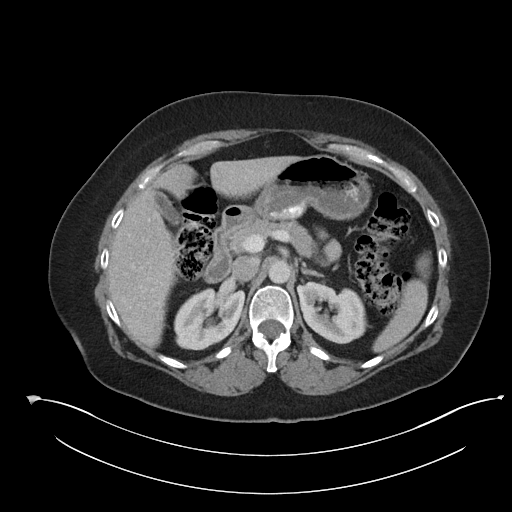
[im 93/108  soft-tissue]
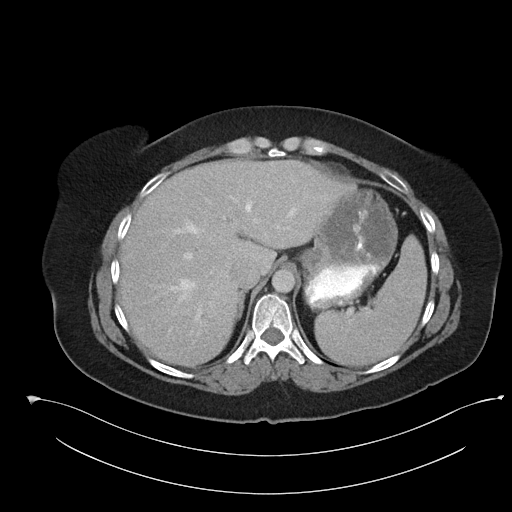
[im 103/108  soft-tissue]
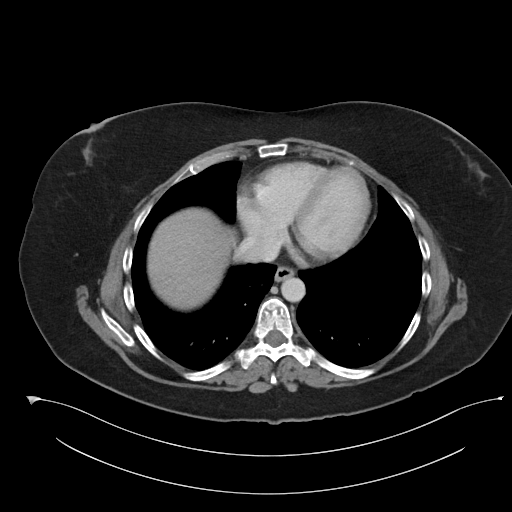

[Series 5: coronal · coronal · 0.95mm/px · 3 of 108 slices shown]
[im 36/108  soft-tissue]
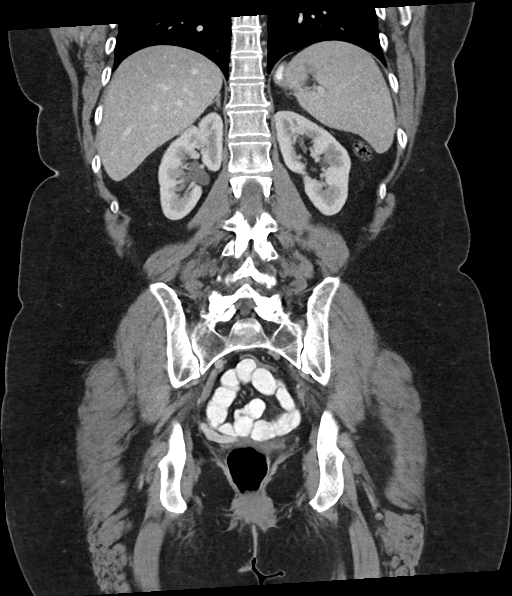
[im 48/108  soft-tissue]
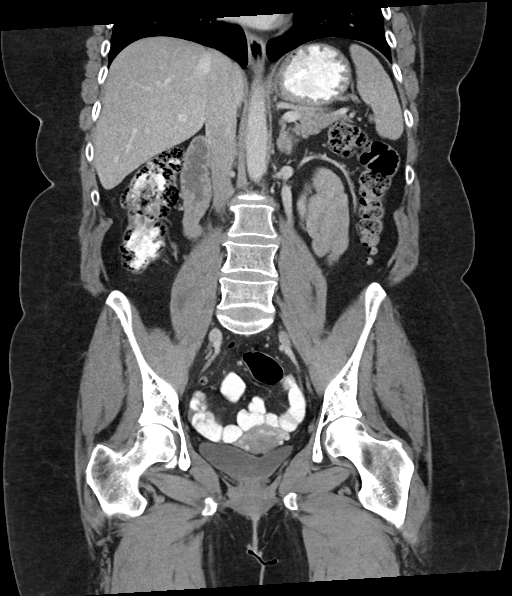
[im 60/108  soft-tissue]
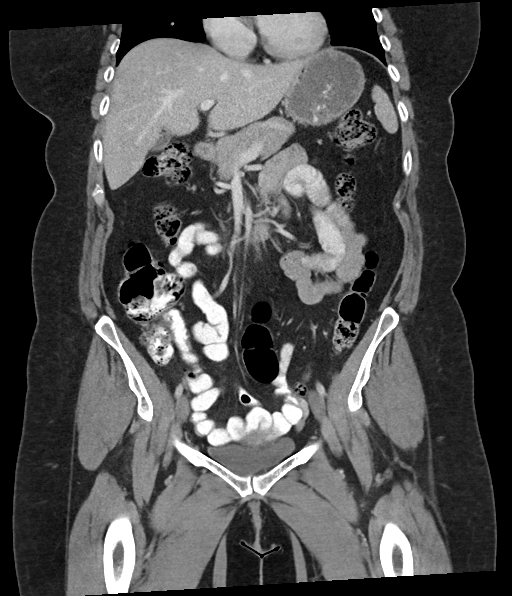

[15 of 46 positions shown; findings below may reference images not displayed]

RADIATION DOSE REDUCTION: This exam was performed according to the
departmental dose-optimization program which includes automated
exposure control, adjustment of the mA and/or kV according to
patient size and/or use of iterative reconstruction technique.

CONTRAST:  85mL OMNIPAQUE IOHEXOL 300 MG/ML  SOLN
FINDINGS: Lower chest: No basilar airspace disease or pleural effusion. The
heart is normal in size.

Hepatobiliary: Diffusely decreased hepatic density typical of
steatosis. No focal liver lesion. The gallbladder is only minimally
distended. No gallbladder inflammation, gallstones, or biliary
dilatation.

Pancreas: No ductal dilatation or inflammation. No evidence of
pancreatic mass

Spleen: Normal in size without focal abnormality. Incidental
splenules inferiorly.

Adrenals/Urinary Tract: Normal adrenal glands. No hydronephrosis or
perinephric edema. Homogeneous renal enhancement with symmetric
excretion on delayed phase imaging. No visualized renal calculi or
focal renal lesion. Urinary bladder is partially distended without
wall thickening.

Stomach/Bowel: Physiologically distended stomach, unremarkable.
Normal small bowel. No bowel obstruction or inflammation.
Administered enteric contrast reaches the colon. The appendix is
well visualized and normal. No appendicitis. No terminal ileal
inflammation. Diffuse moderate colonic stool burden. Diverticulosis
of the descending and sigmoid colon. No diverticulitis.

Vascular/Lymphatic: Normal caliber abdominal aorta. Patent portal
and splenic veins. Patent mesenteric veins. No abdominopelvic
adenopathy

Reproductive: Anteverted uterus. There is prominent periuterine and
adnexal vascularity with dilatation of ther left ovarian vein, 9 mm.
No adnexal mass.

Other: No free air, free fluid, or intra-abdominal fluid collection.
No abdominal wall hernia.

Musculoskeletal: There are no acute or suspicious osseous
abnormalities. Subchondral degenerative cysts in the right
acetabulum.
IMPRESSION: 1. No acute abnormality in the abdomen/pelvis. Particularly, normal
appendix.
2. Colonic diverticulosis without diverticulitis. Moderate colonic
stool burden suggesting constipation.
3. Hepatic steatosis.
4. Prominent periuterine and adnexal vascularity with dilatation of
the left ovarian vein, can be seen with pelvic congestion syndrome.

## 2022-07-22 ENCOUNTER — Encounter (INDEPENDENT_AMBULATORY_CARE_PROVIDER_SITE_OTHER): Payer: Self-pay | Admitting: Gastroenterology

## 2022-08-26 ENCOUNTER — Other Ambulatory Visit (HOSPITAL_COMMUNITY): Payer: Self-pay | Admitting: Obstetrics and Gynecology

## 2022-08-26 DIAGNOSIS — Z1231 Encounter for screening mammogram for malignant neoplasm of breast: Secondary | ICD-10-CM

## 2022-09-03 ENCOUNTER — Other Ambulatory Visit (HOSPITAL_COMMUNITY): Payer: Self-pay

## 2022-09-03 MED ORDER — WEGOVY 1.7 MG/0.75ML ~~LOC~~ SOAJ
1.7000 mg | SUBCUTANEOUS | 0 refills | Status: AC
Start: 2022-09-03 — End: ?
  Filled 2022-09-03: qty 3, 28d supply, fill #0
  Filled 2022-10-07: qty 3, 28d supply, fill #1
  Filled 2022-12-12: qty 3, 28d supply, fill #2

## 2022-09-12 ENCOUNTER — Other Ambulatory Visit (HOSPITAL_COMMUNITY): Payer: Self-pay

## 2022-09-23 ENCOUNTER — Other Ambulatory Visit (HOSPITAL_COMMUNITY): Payer: Self-pay

## 2022-09-24 ENCOUNTER — Other Ambulatory Visit (HOSPITAL_COMMUNITY): Payer: Self-pay

## 2022-10-07 ENCOUNTER — Other Ambulatory Visit: Payer: Self-pay

## 2022-10-07 ENCOUNTER — Ambulatory Visit (HOSPITAL_COMMUNITY)
Admission: RE | Admit: 2022-10-07 | Discharge: 2022-10-07 | Disposition: A | Discharge status: Home / Self Care | Payer: 59 | Source: Ambulatory Visit | Attending: Obstetrics and Gynecology | Admitting: Obstetrics and Gynecology

## 2022-10-07 ENCOUNTER — Other Ambulatory Visit (HOSPITAL_COMMUNITY): Payer: Self-pay

## 2022-10-07 DIAGNOSIS — Z1231 Encounter for screening mammogram for malignant neoplasm of breast: Secondary | ICD-10-CM | POA: Insufficient documentation

## 2022-11-07 ENCOUNTER — Other Ambulatory Visit: Payer: Self-pay

## 2022-11-18 ENCOUNTER — Other Ambulatory Visit (HOSPITAL_COMMUNITY): Payer: Self-pay

## 2022-11-18 DIAGNOSIS — R7301 Impaired fasting glucose: Secondary | ICD-10-CM | POA: Diagnosis not present

## 2022-11-18 DIAGNOSIS — K219 Gastro-esophageal reflux disease without esophagitis: Secondary | ICD-10-CM | POA: Diagnosis not present

## 2022-11-18 DIAGNOSIS — Z6830 Body mass index (BMI) 30.0-30.9, adult: Secondary | ICD-10-CM | POA: Diagnosis not present

## 2022-11-18 DIAGNOSIS — E669 Obesity, unspecified: Secondary | ICD-10-CM | POA: Diagnosis not present

## 2022-11-18 DIAGNOSIS — R948 Abnormal results of function studies of other organs and systems: Secondary | ICD-10-CM | POA: Diagnosis not present

## 2022-11-18 DIAGNOSIS — R7303 Prediabetes: Secondary | ICD-10-CM | POA: Diagnosis not present

## 2022-11-18 DIAGNOSIS — K760 Fatty (change of) liver, not elsewhere classified: Secondary | ICD-10-CM | POA: Diagnosis not present

## 2022-11-18 DIAGNOSIS — R632 Polyphagia: Secondary | ICD-10-CM | POA: Diagnosis not present

## 2022-11-18 MED ORDER — WEGOVY 1.7 MG/0.75ML ~~LOC~~ SOAJ
1.7000 mg | SUBCUTANEOUS | 0 refills | Status: AC
  Filled 2022-11-18 – 2022-11-19 (×2): qty 3, 28d supply, fill #0
  Filled 2022-12-12: qty 3, 28d supply, fill #1
  Filled 2022-12-16 – 2023-01-01 (×2): qty 3, 28d supply, fill #0

## 2022-11-19 ENCOUNTER — Other Ambulatory Visit (HOSPITAL_COMMUNITY): Payer: Self-pay

## 2022-12-12 ENCOUNTER — Other Ambulatory Visit: Payer: Self-pay

## 2022-12-13 ENCOUNTER — Other Ambulatory Visit: Payer: Self-pay

## 2022-12-16 ENCOUNTER — Other Ambulatory Visit (HOSPITAL_COMMUNITY): Payer: Self-pay

## 2022-12-24 DIAGNOSIS — H524 Presbyopia: Secondary | ICD-10-CM | POA: Diagnosis not present

## 2023-01-02 ENCOUNTER — Encounter (HOSPITAL_COMMUNITY): Payer: Self-pay

## 2023-01-02 ENCOUNTER — Other Ambulatory Visit (HOSPITAL_COMMUNITY): Payer: Self-pay

## 2023-01-04 ENCOUNTER — Other Ambulatory Visit (HOSPITAL_COMMUNITY): Payer: Self-pay

## 2023-03-18 DIAGNOSIS — K219 Gastro-esophageal reflux disease without esophagitis: Secondary | ICD-10-CM | POA: Diagnosis not present

## 2023-03-18 DIAGNOSIS — K760 Fatty (change of) liver, not elsewhere classified: Secondary | ICD-10-CM | POA: Diagnosis not present

## 2023-03-18 DIAGNOSIS — R632 Polyphagia: Secondary | ICD-10-CM | POA: Diagnosis not present

## 2023-03-18 DIAGNOSIS — Z6831 Body mass index (BMI) 31.0-31.9, adult: Secondary | ICD-10-CM | POA: Diagnosis not present

## 2023-03-18 DIAGNOSIS — E669 Obesity, unspecified: Secondary | ICD-10-CM | POA: Diagnosis not present

## 2023-05-05 DIAGNOSIS — Z6833 Body mass index (BMI) 33.0-33.9, adult: Secondary | ICD-10-CM | POA: Diagnosis not present

## 2023-05-05 DIAGNOSIS — Z01419 Encounter for gynecological examination (general) (routine) without abnormal findings: Secondary | ICD-10-CM | POA: Diagnosis not present

## 2023-05-13 DIAGNOSIS — R632 Polyphagia: Secondary | ICD-10-CM | POA: Diagnosis not present

## 2023-05-13 DIAGNOSIS — Z6831 Body mass index (BMI) 31.0-31.9, adult: Secondary | ICD-10-CM | POA: Diagnosis not present

## 2023-05-13 DIAGNOSIS — K219 Gastro-esophageal reflux disease without esophagitis: Secondary | ICD-10-CM | POA: Diagnosis not present

## 2023-05-13 DIAGNOSIS — K760 Fatty (change of) liver, not elsewhere classified: Secondary | ICD-10-CM | POA: Diagnosis not present

## 2023-05-13 DIAGNOSIS — E669 Obesity, unspecified: Secondary | ICD-10-CM | POA: Diagnosis not present

## 2023-07-18 DIAGNOSIS — Z13820 Encounter for screening for osteoporosis: Secondary | ICD-10-CM | POA: Diagnosis not present

## 2023-07-24 DIAGNOSIS — L578 Other skin changes due to chronic exposure to nonionizing radiation: Secondary | ICD-10-CM | POA: Diagnosis not present

## 2023-07-24 DIAGNOSIS — Z85828 Personal history of other malignant neoplasm of skin: Secondary | ICD-10-CM | POA: Diagnosis not present

## 2023-07-24 DIAGNOSIS — L738 Other specified follicular disorders: Secondary | ICD-10-CM | POA: Diagnosis not present

## 2023-07-24 DIAGNOSIS — D229 Melanocytic nevi, unspecified: Secondary | ICD-10-CM | POA: Diagnosis not present

## 2023-07-24 DIAGNOSIS — Z86018 Personal history of other benign neoplasm: Secondary | ICD-10-CM | POA: Diagnosis not present

## 2023-07-24 DIAGNOSIS — L814 Other melanin hyperpigmentation: Secondary | ICD-10-CM | POA: Diagnosis not present

## 2023-07-24 DIAGNOSIS — L821 Other seborrheic keratosis: Secondary | ICD-10-CM | POA: Diagnosis not present

## 2023-09-08 ENCOUNTER — Other Ambulatory Visit (HOSPITAL_COMMUNITY): Payer: Self-pay

## 2023-09-08 DIAGNOSIS — Z6832 Body mass index (BMI) 32.0-32.9, adult: Secondary | ICD-10-CM | POA: Diagnosis not present

## 2023-09-08 DIAGNOSIS — E66811 Obesity, class 1: Secondary | ICD-10-CM | POA: Diagnosis not present

## 2023-09-08 DIAGNOSIS — Z1331 Encounter for screening for depression: Secondary | ICD-10-CM | POA: Diagnosis not present

## 2023-09-08 DIAGNOSIS — K760 Fatty (change of) liver, not elsewhere classified: Secondary | ICD-10-CM | POA: Diagnosis not present

## 2023-09-08 DIAGNOSIS — R7301 Impaired fasting glucose: Secondary | ICD-10-CM | POA: Diagnosis not present

## 2023-09-08 MED ORDER — ZEPBOUND 15 MG/0.5ML ~~LOC~~ SOAJ
15.0000 mg | SUBCUTANEOUS | 0 refills | Status: AC
  Filled 2023-09-08 – 2024-07-28 (×4): qty 2, 28d supply, fill #0

## 2023-09-15 ENCOUNTER — Other Ambulatory Visit (HOSPITAL_COMMUNITY): Payer: Self-pay

## 2023-10-31 ENCOUNTER — Other Ambulatory Visit (HOSPITAL_COMMUNITY): Payer: Self-pay

## 2023-10-31 DIAGNOSIS — M810 Age-related osteoporosis without current pathological fracture: Secondary | ICD-10-CM | POA: Diagnosis not present

## 2023-10-31 MED ORDER — RALOXIFENE HCL 60 MG PO TABS
60.0000 mg | ORAL_TABLET | Freq: Every day | ORAL | 4 refills | Status: AC
  Filled 2023-10-31 – 2023-11-05 (×2): qty 90, 90d supply, fill #0
  Filled 2023-12-08: qty 90, 90d supply, fill #1

## 2023-11-05 ENCOUNTER — Other Ambulatory Visit (HOSPITAL_COMMUNITY): Payer: Self-pay

## 2023-11-05 ENCOUNTER — Other Ambulatory Visit: Payer: Self-pay

## 2023-11-10 ENCOUNTER — Other Ambulatory Visit (HOSPITAL_COMMUNITY): Payer: Self-pay

## 2023-12-02 ENCOUNTER — Other Ambulatory Visit (HOSPITAL_COMMUNITY): Payer: Self-pay | Admitting: Obstetrics and Gynecology

## 2023-12-02 DIAGNOSIS — Z1231 Encounter for screening mammogram for malignant neoplasm of breast: Secondary | ICD-10-CM

## 2023-12-08 ENCOUNTER — Other Ambulatory Visit (HOSPITAL_COMMUNITY): Payer: Self-pay

## 2023-12-12 DIAGNOSIS — Z6833 Body mass index (BMI) 33.0-33.9, adult: Secondary | ICD-10-CM | POA: Diagnosis not present

## 2023-12-12 DIAGNOSIS — E66811 Obesity, class 1: Secondary | ICD-10-CM | POA: Diagnosis not present

## 2023-12-12 DIAGNOSIS — R635 Abnormal weight gain: Secondary | ICD-10-CM | POA: Diagnosis not present

## 2023-12-12 DIAGNOSIS — Z79899 Other long term (current) drug therapy: Secondary | ICD-10-CM | POA: Diagnosis not present

## 2023-12-12 DIAGNOSIS — F439 Reaction to severe stress, unspecified: Secondary | ICD-10-CM | POA: Diagnosis not present

## 2023-12-19 ENCOUNTER — Ambulatory Visit (HOSPITAL_COMMUNITY)
Admission: RE | Admit: 2023-12-19 | Discharge: 2023-12-19 | Disposition: A | Discharge status: Home / Self Care | Source: Ambulatory Visit | Attending: Obstetrics and Gynecology | Admitting: Obstetrics and Gynecology

## 2023-12-19 ENCOUNTER — Encounter (HOSPITAL_COMMUNITY): Payer: Self-pay

## 2023-12-19 DIAGNOSIS — Z1231 Encounter for screening mammogram for malignant neoplasm of breast: Secondary | ICD-10-CM | POA: Diagnosis not present

## 2024-02-22 DIAGNOSIS — I890 Lymphedema, not elsewhere classified: Secondary | ICD-10-CM | POA: Diagnosis not present

## 2024-03-23 DIAGNOSIS — I890 Lymphedema, not elsewhere classified: Secondary | ICD-10-CM | POA: Diagnosis not present

## 2024-04-23 DIAGNOSIS — I890 Lymphedema, not elsewhere classified: Secondary | ICD-10-CM | POA: Diagnosis not present

## 2024-04-30 ENCOUNTER — Ambulatory Visit: Admitting: Urology

## 2024-05-14 DIAGNOSIS — Z6834 Body mass index (BMI) 34.0-34.9, adult: Secondary | ICD-10-CM | POA: Diagnosis not present

## 2024-05-14 DIAGNOSIS — Z01419 Encounter for gynecological examination (general) (routine) without abnormal findings: Secondary | ICD-10-CM | POA: Diagnosis not present

## 2024-05-14 DIAGNOSIS — M810 Age-related osteoporosis without current pathological fracture: Secondary | ICD-10-CM | POA: Diagnosis not present

## 2024-05-16 ENCOUNTER — Other Ambulatory Visit (HOSPITAL_COMMUNITY): Payer: Self-pay

## 2024-05-16 MED ORDER — RALOXIFENE HCL 60 MG PO TABS
60.0000 mg | ORAL_TABLET | Freq: Every day | ORAL | 4 refills | Status: AC
  Filled 2024-05-16 – 2024-05-18 (×2): qty 90, 90d supply, fill #0

## 2024-05-18 ENCOUNTER — Other Ambulatory Visit (HOSPITAL_COMMUNITY): Payer: Self-pay

## 2024-05-18 ENCOUNTER — Other Ambulatory Visit: Payer: Self-pay

## 2024-05-24 DIAGNOSIS — I890 Lymphedema, not elsewhere classified: Secondary | ICD-10-CM | POA: Diagnosis not present

## 2024-06-04 ENCOUNTER — Ambulatory Visit: Admitting: Urology

## 2024-06-23 ENCOUNTER — Encounter (INDEPENDENT_AMBULATORY_CARE_PROVIDER_SITE_OTHER): Payer: Self-pay | Admitting: Gastroenterology

## 2024-06-23 DIAGNOSIS — I890 Lymphedema, not elsewhere classified: Secondary | ICD-10-CM | POA: Diagnosis not present

## 2024-07-22 DIAGNOSIS — Z08 Encounter for follow-up examination after completed treatment for malignant neoplasm: Secondary | ICD-10-CM | POA: Diagnosis not present

## 2024-07-22 DIAGNOSIS — L578 Other skin changes due to chronic exposure to nonionizing radiation: Secondary | ICD-10-CM | POA: Diagnosis not present

## 2024-07-22 DIAGNOSIS — D1801 Hemangioma of skin and subcutaneous tissue: Secondary | ICD-10-CM | POA: Diagnosis not present

## 2024-07-22 DIAGNOSIS — L821 Other seborrheic keratosis: Secondary | ICD-10-CM | POA: Diagnosis not present

## 2024-07-22 DIAGNOSIS — L814 Other melanin hyperpigmentation: Secondary | ICD-10-CM | POA: Diagnosis not present

## 2024-07-28 ENCOUNTER — Encounter: Payer: Self-pay | Admitting: Urology

## 2024-07-28 ENCOUNTER — Other Ambulatory Visit: Payer: Self-pay

## 2024-07-28 ENCOUNTER — Other Ambulatory Visit (HOSPITAL_COMMUNITY): Payer: Self-pay

## 2024-07-28 ENCOUNTER — Ambulatory Visit: Admitting: Urology

## 2024-07-28 VITALS — BP 114/73 | HR 65

## 2024-07-28 DIAGNOSIS — R310 Gross hematuria: Secondary | ICD-10-CM

## 2024-07-28 DIAGNOSIS — R39198 Other difficulties with micturition: Secondary | ICD-10-CM | POA: Diagnosis not present

## 2024-07-28 DIAGNOSIS — R1030 Lower abdominal pain, unspecified: Secondary | ICD-10-CM

## 2024-07-28 LAB — URINALYSIS, ROUTINE W REFLEX MICROSCOPIC
Bilirubin, UA: NEGATIVE
Glucose, UA: NEGATIVE
Ketones, UA: NEGATIVE
Leukocytes,UA: NEGATIVE
Nitrite, UA: NEGATIVE
Protein,UA: NEGATIVE
Specific Gravity, UA: 1.03 (ref 1.005–1.030)
Urobilinogen, Ur: 0.2 mg/dL (ref 0.2–1.0)
pH, UA: 6 (ref 5.0–7.5)

## 2024-07-28 LAB — MICROSCOPIC EXAMINATION: Bacteria, UA: NONE SEEN

## 2024-07-28 LAB — BLADDER SCAN AMB NON-IMAGING

## 2024-07-28 MED ORDER — POLYETHYLENE GLYCOL 3350 17 GM/SCOOP PO POWD
17.0000 g | Freq: Every day | ORAL | 3 refills | Status: AC
  Filled 2024-07-28: qty 510, 30d supply, fill #0
  Filled 2024-07-28: qty 476, 28d supply, fill #0

## 2024-07-28 NOTE — Patient Instructions (Signed)

## 2024-07-28 NOTE — Progress Notes (Signed)
   Patient can void prior to the bladder scan. Bladder scan result: 1  Performed By: [facility] LPN

## 2024-07-28 NOTE — Progress Notes (Signed)
 07/28/2024 8:34 AM   [name] [birth year] [id number]  Referring provider: Sheryle Carwin, MD [address] [place],  [place] [address]  Difficulty urinating   HPI: Ms Bottomley is 53yo here for evaluation of difficulty urinating. For the past 8 months she a weaker urinary stream, straining to urinary urgency, low volume urge urinary incontinence. She has urinary frequency every 4-5 hours. She has occasional urinary hesitancy. She drinks over 60oz of water  daily. She has issues with constipation and takes miralax  every other day. She has a hx of AP repair in 2009.    PMH: Past Medical History:  Diagnosis Date   GERD (gastroesophageal reflux disease)    Irritable bowel syndrome    Restless leg syndrome     Surgical History: Past Surgical History:  Procedure Laterality Date   BIOPSY  12/06/2021   Procedure: BIOPSY;  Surgeon: Golda Claudis PENNER, MD;  [facility];  Service: Endoscopy;;   CESAREAN SECTION  09/09/2002   COLONOSCOPY N/A 11/27/2012   Procedure: COLONOSCOPY;  Surgeon: Claudis PENNER Golda, MD;  Location: [facility];  Service: Endoscopy;  Laterality: N/A;  930   COLONOSCOPY N/A 03/01/2021   rehman; diverticulosos in sigmoid colon, no specimens, recommend 7 year repeat r/t previous colon polyps   ESOPHAGOGASTRODUODENOSCOPY (EGD) WITH PROPOFOL  N/A 12/06/2021   Procedure: ESOPHAGOGASTRODUODENOSCOPY (EGD) WITH PROPOFOL ;  Surgeon: Golda Claudis PENNER, MD;  Location: [facility];  Service: Endoscopy;  Laterality: N/A;  135   RECTOCELE REPAIR  09/10/2007   UPPER GASTROINTESTINAL ENDOSCOPY  09/09/2004    Home Medications:  Allergies as of 07/28/2024   No Known Allergies      Medication List        Accurate as of July 28, 2024  8:34 AM. If you have any questions, ask your nurse or doctor.          dicyclomine  10 MG capsule Commonly known as: BENTYL  Take 1 capsule (10 mg total) by mouth daily before breakfast.   esomeprazole  40 MG  capsule Commonly known as: NexIUM  Take 1 capsule (40 mg total) by mouth daily before breakfast.   Insulin  Pen Needle 32G X 4 MM Misc Inject 1 pen needle into the skin daily to inject Saxenda .   TechLite Pen Needles 32G X 4 MM Misc Generic drug: Insulin  Pen Needle Use 1 pen needle to inject Saxenda  under the skin once daily.   multivitamin with minerals Tabs tablet Take 1 tablet by mouth daily.   ondansetron  4 MG disintegrating tablet Commonly known as: ZOFRAN -ODT Dissolve 1 tablet (4 mg total) by mouth every 6 (six) hours as needed 1 hour before meal for 10 days   polyethylene glycol 17 g packet Commonly known as: MIRALAX  / GLYCOLAX  Take 17 g by mouth daily.   promethazine  25 MG tablet Commonly known as: PHENERGAN  Take 1 tablet (25 mg total) by mouth 4 (four) times daily as needed for vomiting   raloxifene  60 MG tablet Commonly known as: EVISTA  Take 1 tablet (60 mg total) by mouth daily.   raloxifene  60 MG tablet Commonly known as: EVISTA  Take 1 tablet (60 mg total) by mouth daily.   Saxenda  18 MG/3ML Sopn Generic drug: Liraglutide  -Weight Management Inject 3 mg into the skin daily.   VITAMIN D PO Take 1 tablet by mouth daily.   Wegovy  1.7 MG/0.75ML Soaj SQ injection Generic drug: semaglutide -weight management Inject 1.7 mg into the skin once a week.   Wegovy  1.7 MG/0.75ML Soaj SQ injection Generic drug:  semaglutide -weight management Inject 1.7 mg into the skin once a week.   Zepbound  15 MG/0.5ML Pen Generic drug: tirzepatide  Inject 15 mg into the skin once a week.        Allergies: No Known Allergies  Family History: Family History  Problem Relation Age of Onset   Healthy Mother    Hypertension Father    Healthy Sister    Healthy Brother    Healthy Daughter    Healthy Son    Colon cancer Other     Social History:  reports that she quit smoking about 18 years ago. Her smoking use included cigarettes. She started smoking about 39 years ago. She  has never used smokeless tobacco. She reports current alcohol use. She reports that she does not use drugs.  ROS: All other review of systems were reviewed and are negative except what is noted above in HPI  Physical Exam: BP 114/73   Pulse 65   Constitutional:  Alert and oriented, No acute distress. HEENT: [place] AT, moist mucus membranes.  Trachea midline, no masses. Cardiovascular: No clubbing, cyanosis, or edema. Respiratory: Normal respiratory effort, no increased work of breathing. GI: Abdomen is soft, nontender, nondistended, no abdominal masses GU: No CVA tenderness.  Lymph: No cervical or inguinal lymphadenopathy. Skin: No rashes, bruises or suspicious lesions. Neurologic: Grossly intact, no focal deficits, moving all 4 extremities. Psychiatric: Normal mood and affect.  Laboratory Data: Lab Results  Component Value Date   WBC 4.9 10/19/2021   HGB 13.3 10/19/2021   HCT 39.2 10/19/2021   MCV 88.3 10/19/2021   PLT 213 10/19/2021    Lab Results  Component Value Date   CREATININE 0.91 10/19/2021    No results found for: PSA  No results found for: TESTOSTERONE  No results found for: HGBA1C  Urinalysis    Component Value Date/Time   COLORURINE YELLOW 08/25/2012 1500   APPEARANCEUR CLEAR 08/25/2012 1500   LABSPEC 1.026 08/25/2012 1500   PHURINE 6.0 08/25/2012 1500   GLUCOSEU NEG 08/25/2012 1500   HGBUR NEG 08/25/2012 1500   BILIRUBINUR NEG 08/25/2012 1500   KETONESUR NEG 08/25/2012 1500   PROTEINUR NEG 08/25/2012 1500   UROBILINOGEN 0.2 08/25/2012 1500   NITRITE NEG 08/25/2012 1500   LEUKOCYTESUR NEG 08/25/2012 1500    No results found for: LABMICR, WBCUA, RBCUA, LABEPIT, MUCUS, BACTERIA  Pertinent Imaging:  No results found for this or any previous visit.  No results found for this or any previous visit.  No results found for this or any previous visit.  No results found for this or any previous visit.  No results found for this or  any previous visit.  No results found for this or any previous visit.  No results found for this or any previous visit.  No results found for this or any previous visit.   Assessment & Plan:    1. Difficulty voiding (Primary) We will address her constipation prior to starting alpha blocker therapy. Miralax 17g daily. Followup 3 months with [facility] - Urinalysis, Routine w reflex microscopic - BLADDER SCAN AMB NON-IMAGING   No follow-ups on file.  Belvie Clara, MD  [facility] [place]

## 2024-08-23 DIAGNOSIS — I890 Lymphedema, not elsewhere classified: Secondary | ICD-10-CM | POA: Diagnosis not present

## 2024-12-24 ENCOUNTER — Ambulatory Visit: Admitting: Urology
